# Patient Record
Sex: Male | Born: 1970 | Race: White | Hispanic: No | Marital: Married | Smoking: Never smoker
Health system: Southern US, Community
[De-identification: ages and names within clinical notes are randomized; demographics above are authoritative.]

## PROBLEM LIST (undated history)

## (undated) DIAGNOSIS — E119 Type 2 diabetes mellitus without complications: Secondary | ICD-10-CM

## (undated) HISTORY — PX: NO PAST SURGERIES: SHX2092

---

## 2021-05-31 ENCOUNTER — Encounter (HOSPITAL_COMMUNITY): Payer: Self-pay

## 2021-05-31 ENCOUNTER — Inpatient Hospital Stay (HOSPITAL_COMMUNITY): Payer: BC Managed Care – PPO

## 2021-05-31 ENCOUNTER — Inpatient Hospital Stay (HOSPITAL_COMMUNITY)
Admission: EM | Admit: 2021-05-31 | Discharge: 2021-06-02 | DRG: 061 | Disposition: A | Discharge status: Home / Self Care | Payer: BC Managed Care – PPO | Attending: Neurology | Admitting: Neurology

## 2021-05-31 ENCOUNTER — Other Ambulatory Visit: Payer: Self-pay

## 2021-05-31 ENCOUNTER — Emergency Department (HOSPITAL_COMMUNITY): Payer: BC Managed Care – PPO

## 2021-05-31 DIAGNOSIS — I10 Essential (primary) hypertension: Secondary | ICD-10-CM | POA: Diagnosis present

## 2021-05-31 DIAGNOSIS — G4733 Obstructive sleep apnea (adult) (pediatric): Secondary | ICD-10-CM | POA: Diagnosis present

## 2021-05-31 DIAGNOSIS — E119 Type 2 diabetes mellitus without complications: Secondary | ICD-10-CM | POA: Diagnosis present

## 2021-05-31 DIAGNOSIS — I639 Cerebral infarction, unspecified: Secondary | ICD-10-CM | POA: Diagnosis not present

## 2021-05-31 DIAGNOSIS — R471 Dysarthria and anarthria: Secondary | ICD-10-CM | POA: Diagnosis present

## 2021-05-31 DIAGNOSIS — I63212 Cerebral infarction due to unspecified occlusion or stenosis of left vertebral arteries: Secondary | ICD-10-CM | POA: Diagnosis present

## 2021-05-31 DIAGNOSIS — E785 Hyperlipidemia, unspecified: Secondary | ICD-10-CM | POA: Diagnosis present

## 2021-05-31 DIAGNOSIS — I63311 Cerebral infarction due to thrombosis of right middle cerebral artery: Secondary | ICD-10-CM | POA: Diagnosis present

## 2021-05-31 DIAGNOSIS — G8194 Hemiplegia, unspecified affecting left nondominant side: Secondary | ICD-10-CM | POA: Diagnosis present

## 2021-05-31 DIAGNOSIS — Z79899 Other long term (current) drug therapy: Secondary | ICD-10-CM | POA: Diagnosis not present

## 2021-05-31 DIAGNOSIS — R29703 NIHSS score 3: Secondary | ICD-10-CM | POA: Diagnosis present

## 2021-05-31 DIAGNOSIS — I6389 Other cerebral infarction: Secondary | ICD-10-CM | POA: Diagnosis not present

## 2021-05-31 DIAGNOSIS — Z91018 Allergy to other foods: Secondary | ICD-10-CM

## 2021-05-31 DIAGNOSIS — R29810 Facial weakness: Secondary | ICD-10-CM | POA: Diagnosis present

## 2021-05-31 DIAGNOSIS — R1310 Dysphagia, unspecified: Secondary | ICD-10-CM | POA: Diagnosis present

## 2021-05-31 DIAGNOSIS — E876 Hypokalemia: Secondary | ICD-10-CM | POA: Diagnosis present

## 2021-05-31 DIAGNOSIS — Z7984 Long term (current) use of oral hypoglycemic drugs: Secondary | ICD-10-CM

## 2021-05-31 HISTORY — DX: Type 2 diabetes mellitus without complications: E11.9

## 2021-05-31 LAB — GLUCOSE, CAPILLARY
Glucose-Capillary: 197 mg/dL — ABNORMAL HIGH (ref 70–99)
Glucose-Capillary: 175 mg/dL — ABNORMAL HIGH (ref 70–99)
Glucose-Capillary: 200 mg/dL — ABNORMAL HIGH (ref 70–99)
Glucose-Capillary: 187 mg/dL — ABNORMAL HIGH (ref 70–99)
Glucose-Capillary: 149 mg/dL — ABNORMAL HIGH (ref 70–99)
Glucose-Capillary: 228 mg/dL — ABNORMAL HIGH (ref 70–99)

## 2021-05-31 LAB — I-STAT CHEM 8, ED
BUN: 15 mg/dL (ref 6–20)
Calcium, Ion: 1.1 mmol/L — ABNORMAL LOW (ref 1.15–1.40)
Chloride: 105 mmol/L (ref 98–111)
Creatinine, Ser: 0.5 mg/dL — ABNORMAL LOW (ref 0.61–1.24)
Glucose, Bld: 250 mg/dL — ABNORMAL HIGH (ref 70–99)
HCT: 49 % (ref 39.0–52.0)
Hemoglobin: 16.7 g/dL (ref 13.0–17.0)
Potassium: 3.1 mmol/L — ABNORMAL LOW (ref 3.5–5.1)
Sodium: 142 mmol/L (ref 135–145)
TCO2: 22 mmol/L (ref 22–32)

## 2021-05-31 LAB — DIFFERENTIAL
Abs Immature Granulocytes: 0.04 10*3/uL (ref 0.00–0.07)
Basophils Absolute: 0.1 10*3/uL (ref 0.0–0.1)
Basophils Relative: 1 %
Eosinophils Absolute: 0 10*3/uL (ref 0.0–0.5)
Eosinophils Relative: 0 %
Immature Granulocytes: 0 %
Lymphocytes Relative: 12 %
Lymphs Abs: 1.3 10*3/uL (ref 0.7–4.0)
Monocytes Absolute: 0.6 10*3/uL (ref 0.1–1.0)
Monocytes Relative: 6 %
Neutro Abs: 8.6 10*3/uL — ABNORMAL HIGH (ref 1.7–7.7)
Neutrophils Relative %: 81 %

## 2021-05-31 LAB — HIV ANTIBODY (ROUTINE TESTING W REFLEX): HIV Screen 4th Generation wRfx: NONREACTIVE

## 2021-05-31 LAB — LIPID PANEL
Cholesterol: 247 mg/dL — ABNORMAL HIGH (ref 0–200)
HDL: 44 mg/dL (ref >40)
LDL Cholesterol: 196 mg/dL — ABNORMAL HIGH (ref 0–99)
Total CHOL/HDL Ratio: 5.6 RATIO
Triglycerides: 37 mg/dL (ref <150)
VLDL: 7 mg/dL (ref 0–40)

## 2021-05-31 LAB — CBC
HCT: 46.3 % (ref 39.0–52.0)
Hemoglobin: 15.5 g/dL (ref 13.0–17.0)
MCH: 28.8 pg (ref 26.0–34.0)
MCHC: 33.5 g/dL (ref 30.0–36.0)
MCV: 86.1 fL (ref 80.0–100.0)
Platelets: 240 10*3/uL (ref 150–400)
RBC: 5.38 MIL/uL (ref 4.22–5.81)
RDW: 12.9 % (ref 11.5–15.5)
WBC: 10.7 10*3/uL — ABNORMAL HIGH (ref 4.0–10.5)
nRBC: 0 % (ref 0.0–0.2)

## 2021-05-31 LAB — CBG MONITORING, ED: Glucose-Capillary: 222 mg/dL — ABNORMAL HIGH (ref 70–99)

## 2021-05-31 LAB — COMPREHENSIVE METABOLIC PANEL
ALT: 23 U/L (ref 0–44)
AST: 17 U/L (ref 15–41)
Albumin: 4.5 g/dL (ref 3.5–5.0)
Alkaline Phosphatase: 88 U/L (ref 38–126)
Anion gap: 13 (ref 5–15)
BUN: 13 mg/dL (ref 6–20)
CO2: 21 mmol/L — ABNORMAL LOW (ref 22–32)
Calcium: 9.2 mg/dL (ref 8.9–10.3)
Chloride: 106 mmol/L (ref 98–111)
Creatinine, Ser: 0.69 mg/dL (ref 0.61–1.24)
GFR, Estimated: >60 mL/min (ref >60)
Glucose, Bld: 246 mg/dL — ABNORMAL HIGH (ref 70–99)
Potassium: 3.1 mmol/L — ABNORMAL LOW (ref 3.5–5.1)
Sodium: 140 mmol/L (ref 135–145)
Total Bilirubin: 1.1 mg/dL (ref 0.3–1.2)
Total Protein: 7.6 g/dL (ref 6.5–8.1)

## 2021-05-31 LAB — HEMOGLOBIN A1C
Hgb A1c MFr Bld: 9.5 % — ABNORMAL HIGH (ref 4.8–5.6)
Mean Plasma Glucose: 225.95 mg/dL

## 2021-05-31 LAB — PROTIME-INR
INR: 1 (ref 0.8–1.2)
Prothrombin Time: 13 seconds (ref 11.4–15.2)

## 2021-05-31 LAB — APTT: aPTT: 24 seconds (ref 24–36)

## 2021-05-31 LAB — MRSA NEXT GEN BY PCR, NASAL: MRSA by PCR Next Gen: NOT DETECTED

## 2021-05-31 IMAGING — MR MR HEAD W/O CM
10 of 11 series · 43 of 48 positions shown · non-contrast
Comparison: Comparison made with prior head CTs and CTA from
earlier the same day.

CLINICAL DATA: Initial evaluation for acute neuro deficit, stroke
suspected

EXAM:
MRI HEAD WITHOUT CONTRAST
TECHNIQUE: Multiplanar, multiecho pulse sequences of the brain and surrounding
structures were obtained without intravenous contrast.

[Series 5: DWI · axial · 3.0mm · 0.88mm/px · z∈[-96,+50]mm · 10 of 100 slices shown (1 of 4)]
[im 1/100]
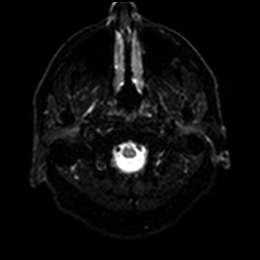
[im 12/100]
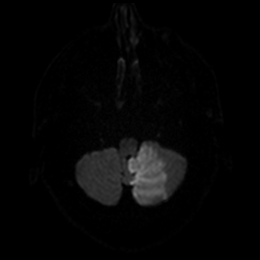
[im 23/100]
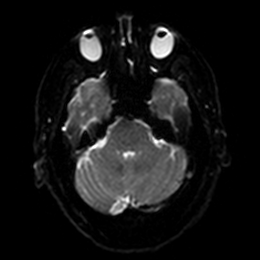
[im 34/100]
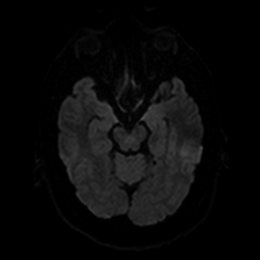
[im 45/100]
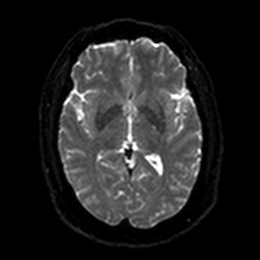
[im 56/100]
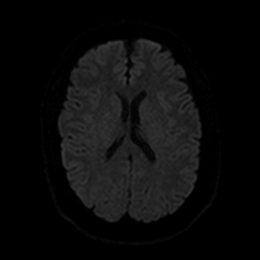
[im 67/100]
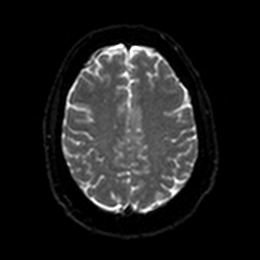
[im 78/100]
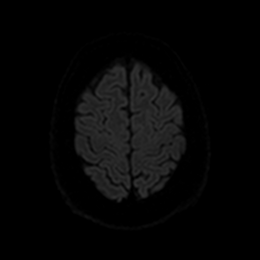
[im 89/100]
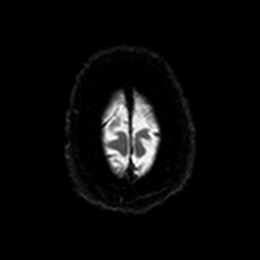
[im 100/100]
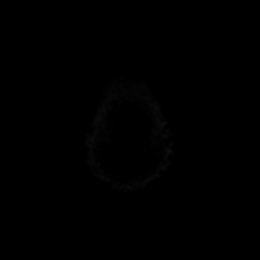

[Series 6: DWI · axial · 3.0mm · 0.88mm/px · z∈[-96,+50]mm · 5 of 50 slices shown (2 of 4)]
[im 1/50]
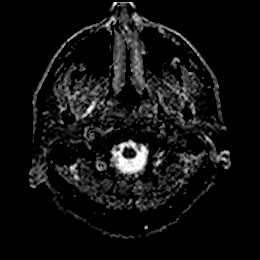
[im 13/50]
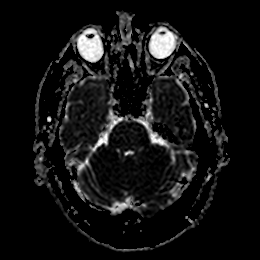
[im 25/50]
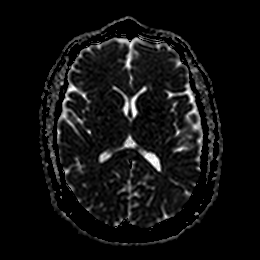
[im 37/50]
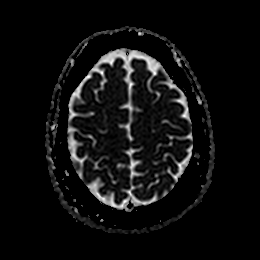
[im 50/50]
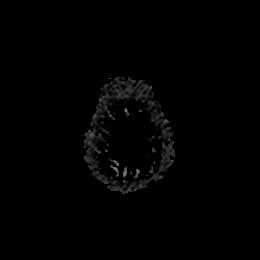

[Series 7: DWI · coronal · 4.0mm · 0.88mm/px · 6 of 64 slices shown (3 of 4)]
[im 1/64]
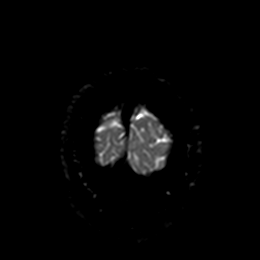
[im 13/64]
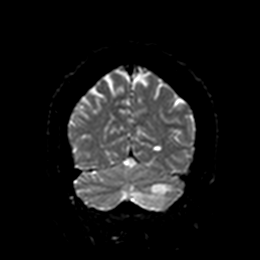
[im 26/64]
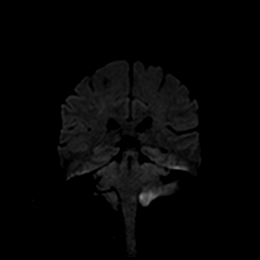
[im 38/64]
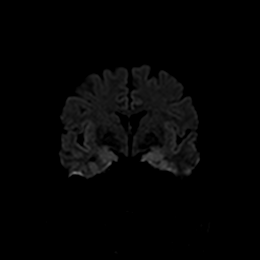
[im 51/64]
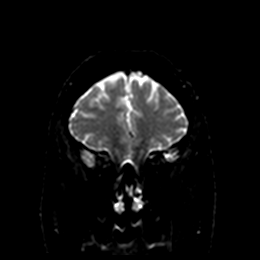
[im 64/64]
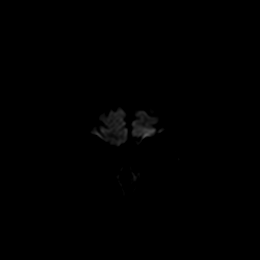

[Series 8: DWI · coronal · 4.0mm · 0.88mm/px · 3 of 32 slices shown (4 of 4)]
[im 1/32]
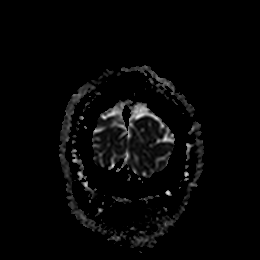
[im 16/32]
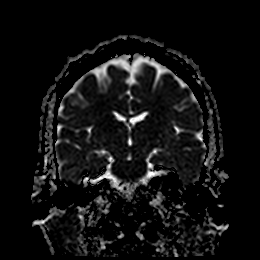
[im 32/32]
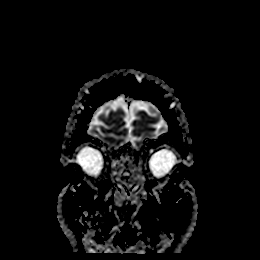

[Series 9: T1 · sagittal · 5.0mm · 0.78mm/px · 2 of 23 slices shown]
[im 1/23]
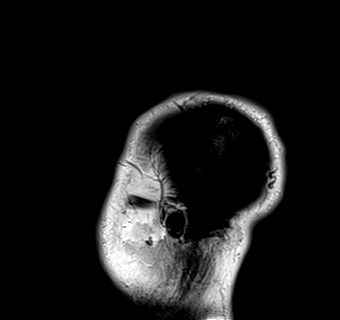
[im 23/23]
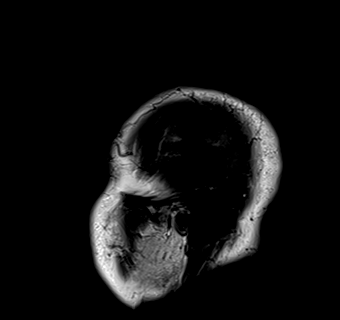

[Series 10: T2 · axial · 5.0mm · 0.72mm/px · z∈[-95,+49]mm · 2 of 25 slices shown (1 of 2)]
[im 1/25]
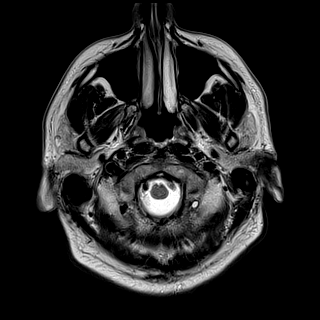
[im 25/25]
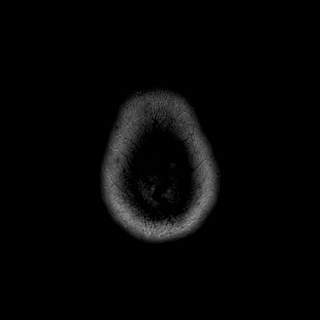

[Series 11: FLAIR · axial · 5.0mm · 0.45mm/px · z∈[-96,+48]mm · 2 of 25 slices shown]
[im 1/25]
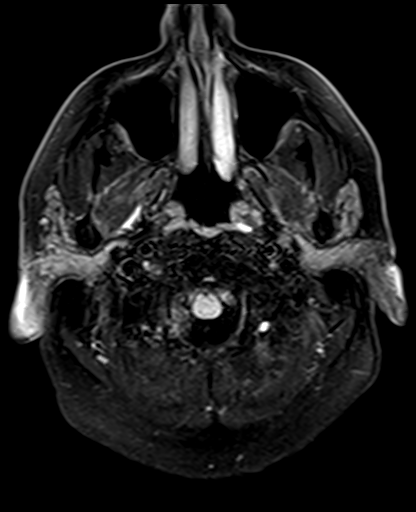
[im 25/25]
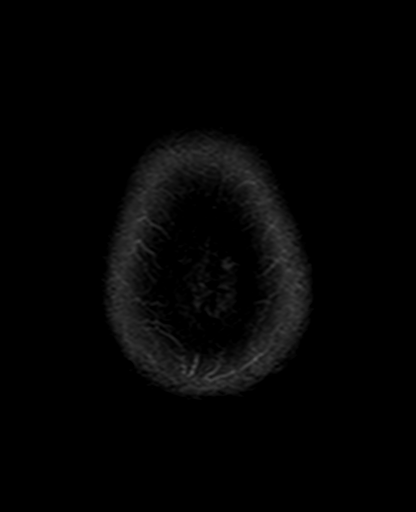

[Series 13: pha_images · axial · 3.0mm · 0.90mm/px · z∈[-112,+58]mm · 5 of 58 slices shown]
[im 1/58]
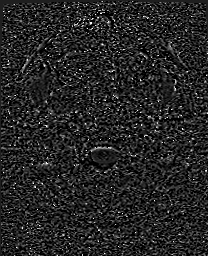
[im 15/58]
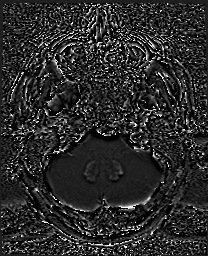
[im 29/58]
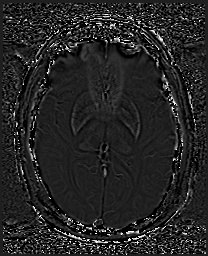
[im 43/58]
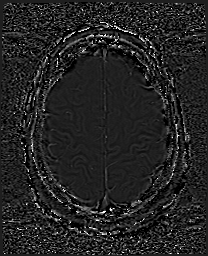
[im 58/58]
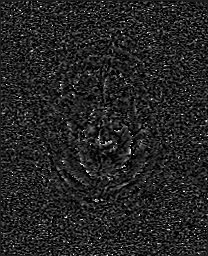

[Series 14: swi_images · axial · 3.0mm · 0.90mm/px · z∈[-112,+64]mm · 5 of 60 slices shown]
[im 1/60]
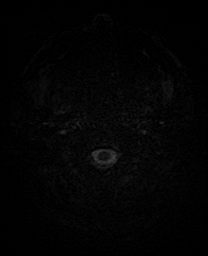
[im 15/60]
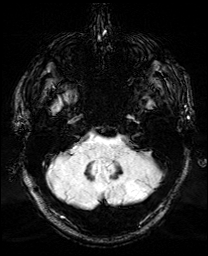
[im 30/60]
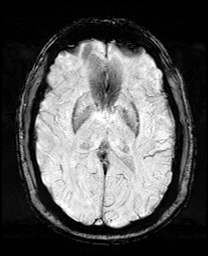
[im 45/60]
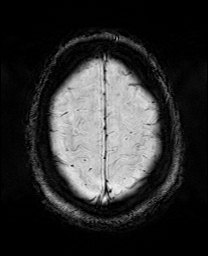
[im 60/60]
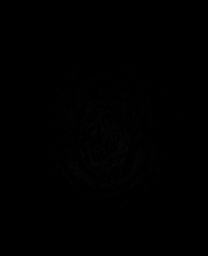

[Series 17: T2 · coronal · 5.0mm · 0.34mm/px · 3 of 29 slices shown (2 of 2)]
[im 1/29]
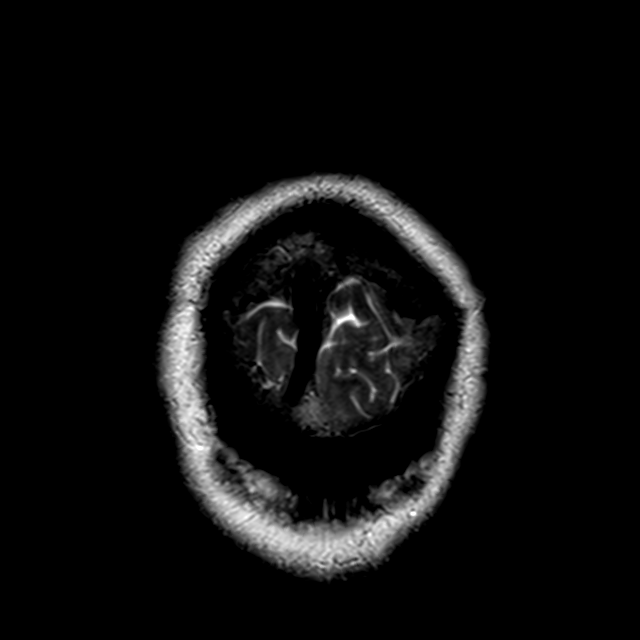
[im 15/29]
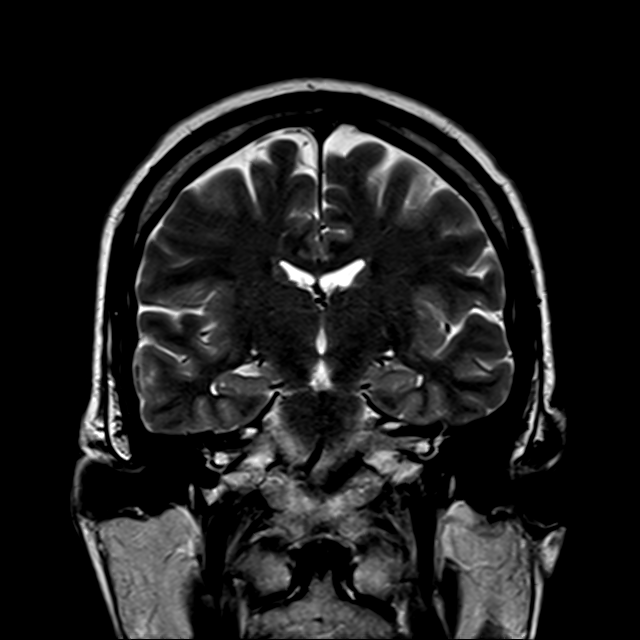
[im 29/29]
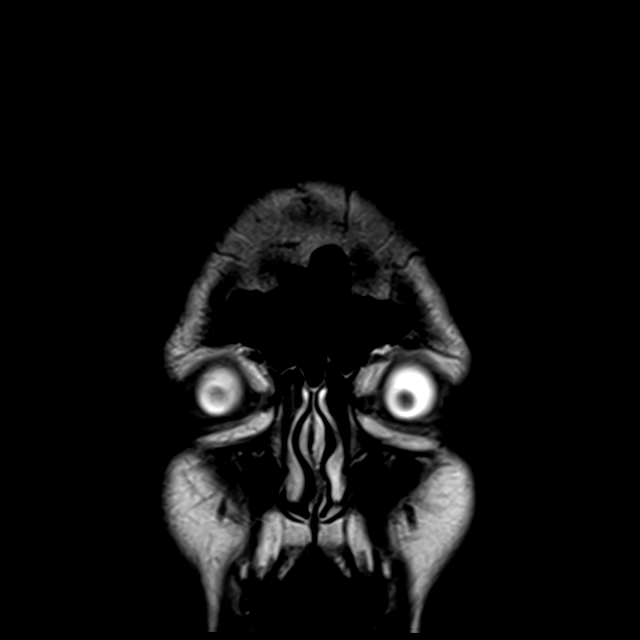

[43 of 48 positions shown; findings below may reference images not displayed]

FINDINGS: Brain: Cerebral volume within normal limits. No significant cerebral
white matter disease.

Confluent restricted diffusion involving the inferior left
cerebellum consistent with an evolving acute left PICA distribution
infarct (series 5, image 56). Area of infarction measures
approximately 5.3 x 3.3 cm in size. Minimal patchy involvement of
the left lateral margin of the medulla noted as well (series 5,
image 55). No associated hemorrhage. Mild localized edema without
significant regional mass effect at this time. Fourth ventricle and
fourth ventricular outflow tract remain patent at this time.

No other evidence for acute or subacute ischemia. Gray-white matter
differentiation otherwise maintained. No encephalomalacia to suggest
chronic cortical infarction or other insult. No acute or chronic
intracranial blood products.

No mass lesion or midline shift. No hydrocephalus or extra-axial
fluid collection. Pituitary gland suprasellar region normal. Midline
structures intact and normal.

Vascular: Loss of normal flow void within the left V4 segment,
consistent with previously identified occlusion. Major intracranial
vascular flow voids otherwise maintained.

Skull and upper cervical spine: Craniocervical junction within
normal limits. Bone marrow signal intensity normal. No scalp soft
tissue abnormality.

Sinuses/Orbits: Conjugate right gaze noted. Globes orbital soft
tissues demonstrate no other acute finding. Paranasal sinuses are
otherwise clear. No mastoid effusion. Inner ear structures grossly
normal.

Other: None.
IMPRESSION: 1. Evolving acute left PICA distribution infarct involving the left
cerebellum and adjacent left lateral medulla as above. No associated
hemorrhage. Localized edema without significant regional mass
effect. Fourth ventricle and fourth ventricular outflow tract remain
patent at this time. No hydrocephalus.
2. Loss of normal flow void within the left V4 segment, consistent
with previously identified occlusion.
3. Otherwise normal brain MRI.

## 2021-05-31 IMAGING — CT CT HEAD W/O CM
4 series · 16 of 47 positions shown, 18 images · non-contrast
Comparison: Head CT and CTA head and neck earlier today since [7U]
hours.

CLINICAL DATA: 50-year-old male with sudden severe headache. Code
stroke presentation [7U] hours today. Distal left vertebral artery
occlusion on CTA.



[Series 3: head without · axial · non-contrast · 0.45mm/px · z∈[-121,+4]mm · 7 of 35 slices shown, 9 images]
[im 5/35  brain]
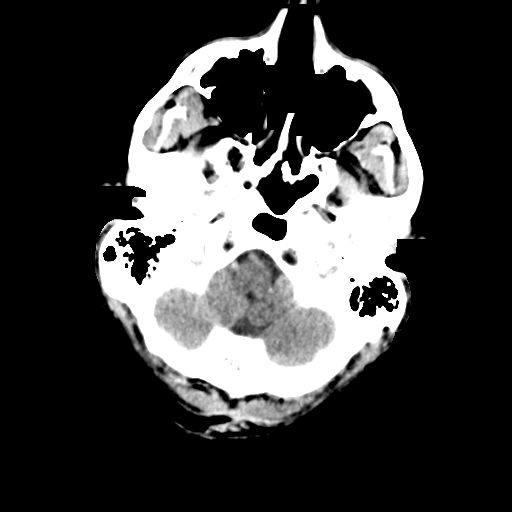
[im 5/35  bone]
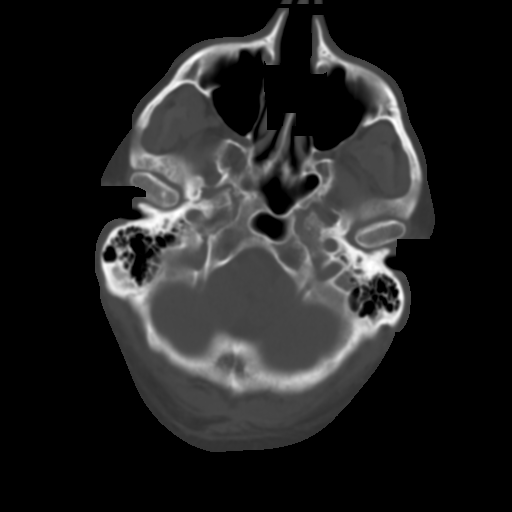
[im 9/35  brain]
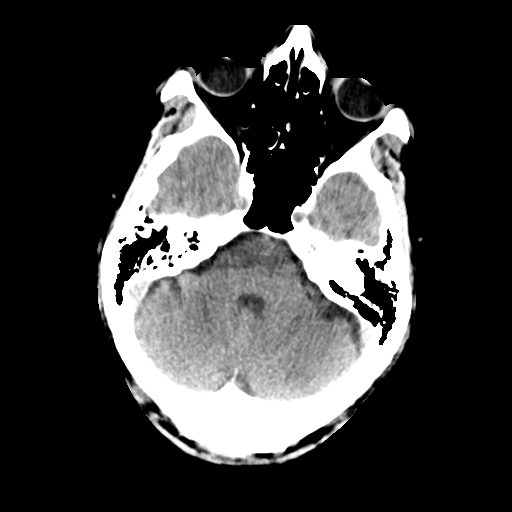
[im 13/35  brain]
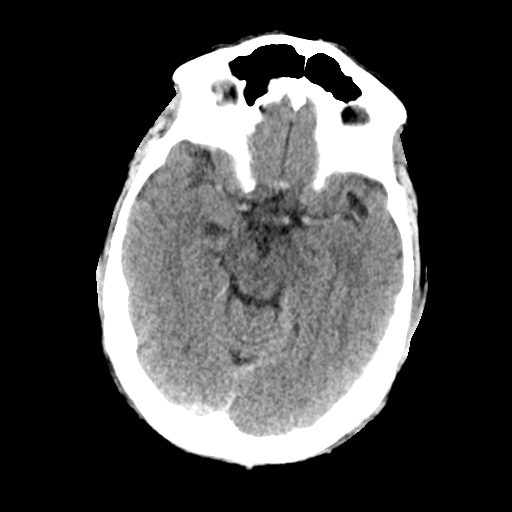
[im 18/35  brain]
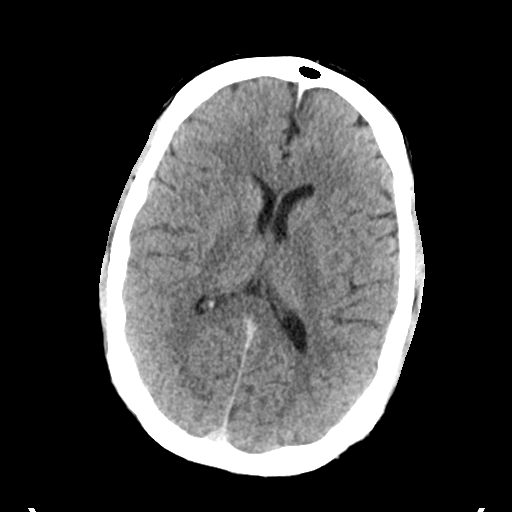
[im 22/35  brain]
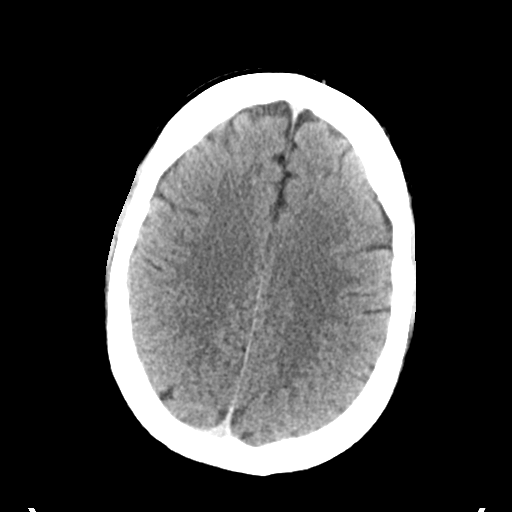
[im 22/35  bone]
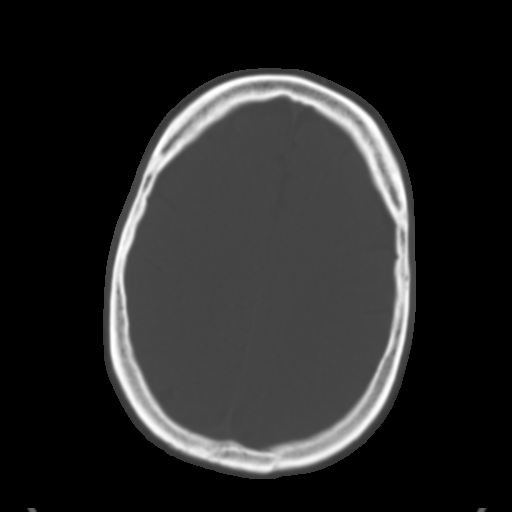
[im 26/35  brain]
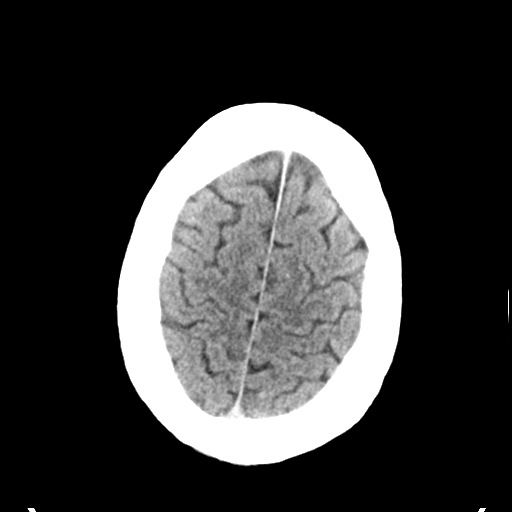
[im 30/35  brain]
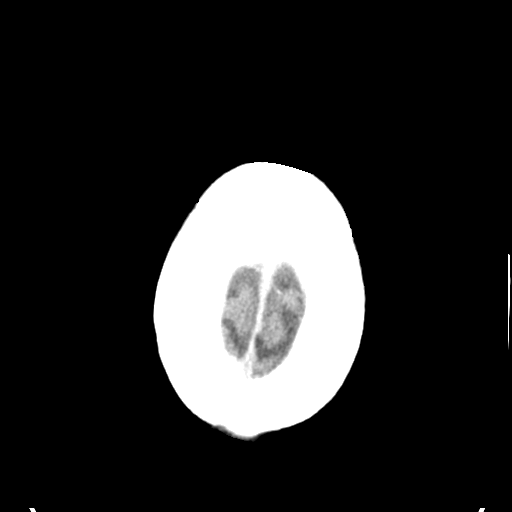

[Series 4: head bone · axial · 0.45mm/px · z∈[-125,-89]mm · 3 of 88 slices shown]
[im 9/88  bone]
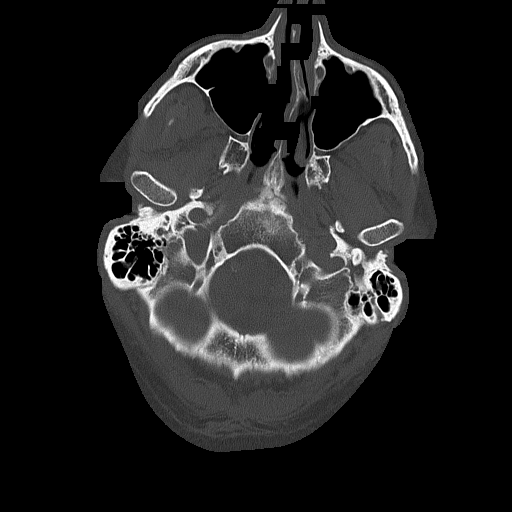
[im 18/88  bone]
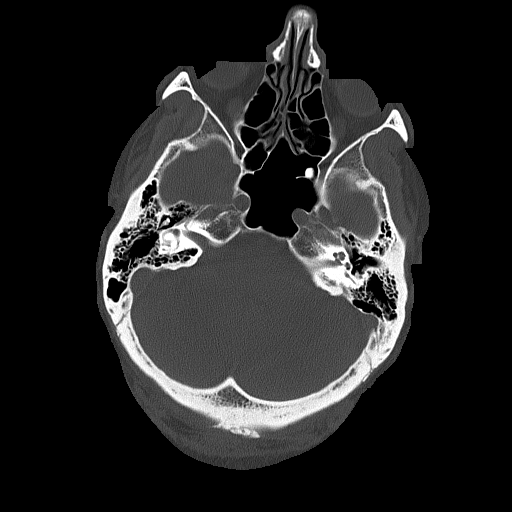
[im 27/88  bone]
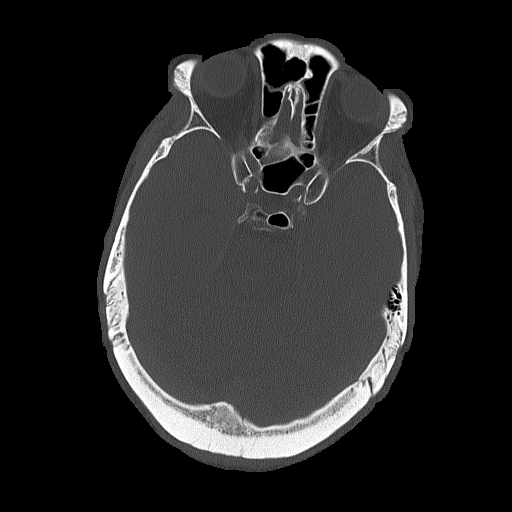

[Series 5: head without cor · coronal · non-contrast · 0.34mm/px · 3 of 73 slices shown]
[im 29/73  brain]
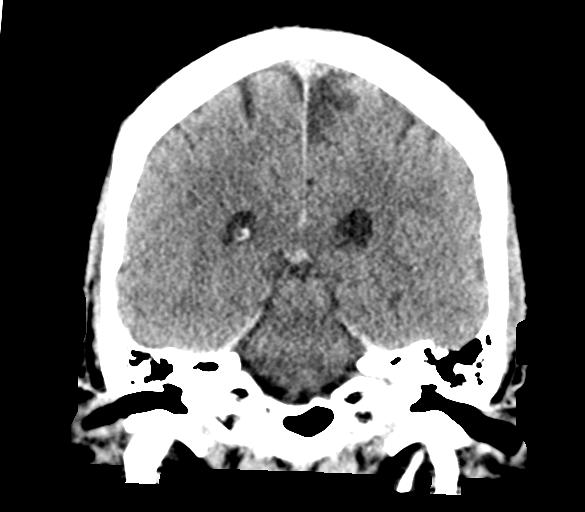
[im 34/73  brain]
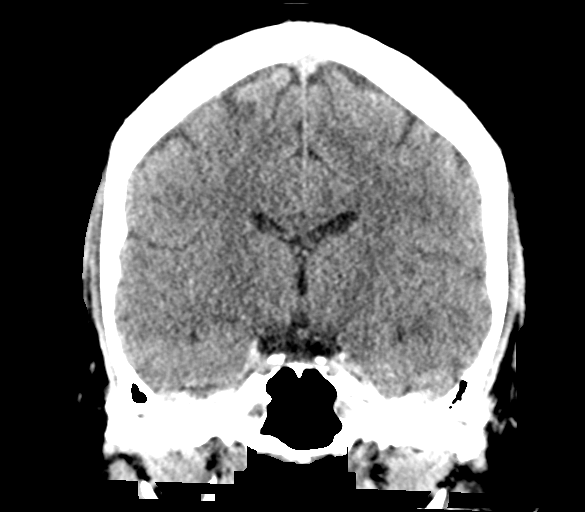
[im 39/73  brain]
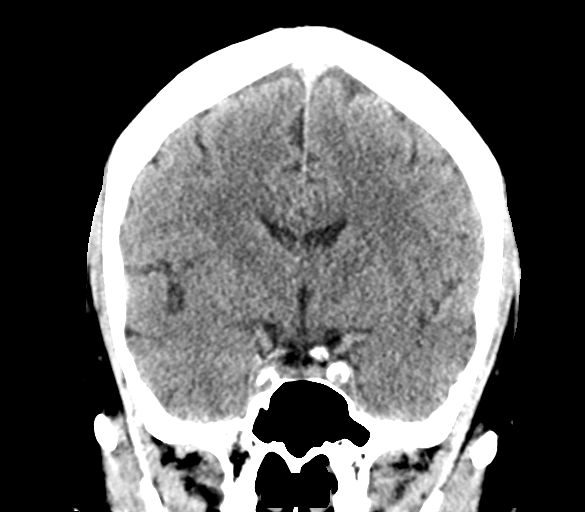

[Series 6: head without sag · sagittal · non-contrast · 0.33mm/px · 3 of 63 slices shown]
[im 21/63  brain]
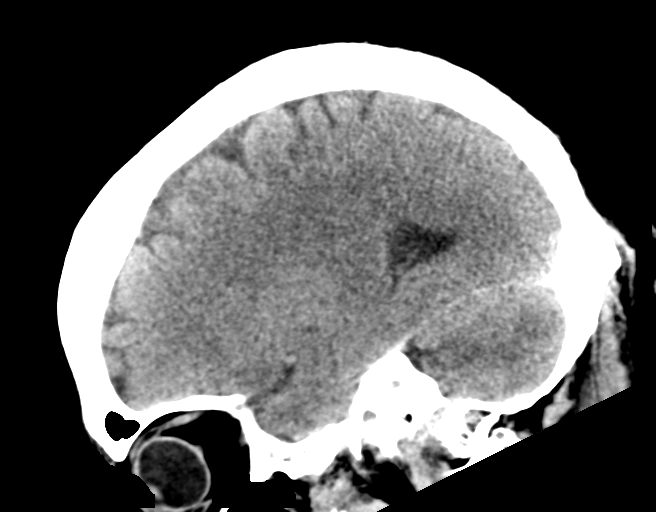
[im 32/63  brain]
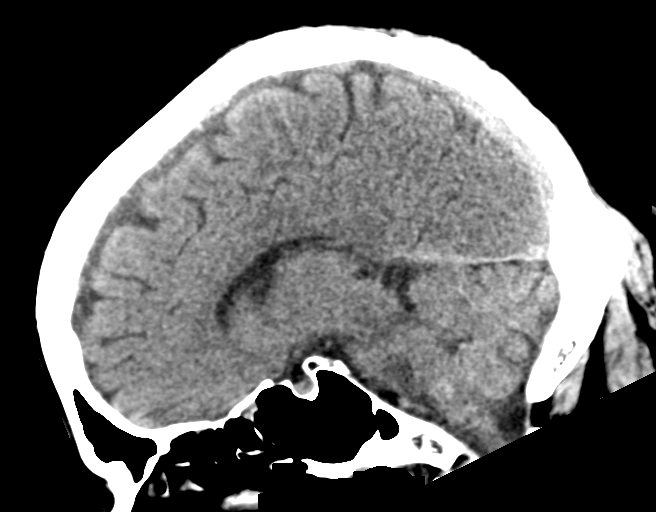
[im 42/63  brain]
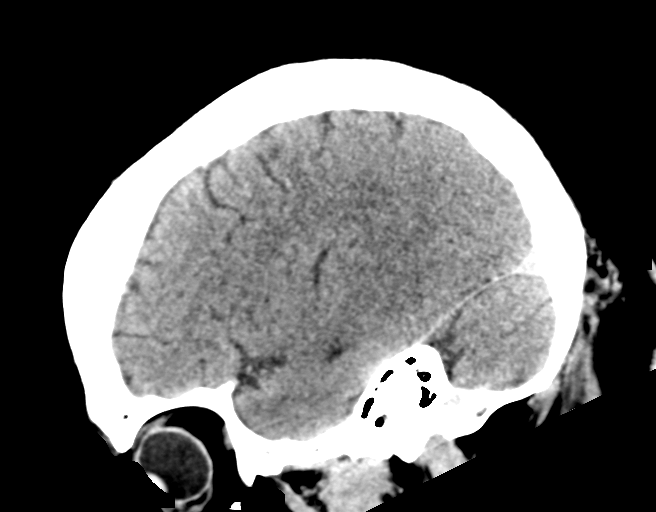

[16 of 47 positions shown; findings below may reference images not displayed]

FINDINGS: Brain: Normal cerebral volume.

Subtle new hypodensity left cerebellum PICA territory (coronal image
59 now versus series 5, image 60 on the initial head CT). No
associated hemorrhage or mass effect.

Elsewhere gray-white matter differentiation appears stable. No other
acute or evolving infarct identified. No midline shift,
ventriculomegaly, mass effect, evidence of mass lesion, or
intracranial hemorrhage identified.

Vascular: Calcified atherosclerosis at the skull base. Mild if any
residual intravascular contrast.

Skull: No acute osseous abnormality identified.

Sinuses/Orbits: Visualized paranasal sinuses and mastoids are stable
and well aerated.

Other: Visualized orbits and scalp soft tissues are within normal
limits.
IMPRESSION: 1. Head CT appearance now highly suspicious for evolving Left PICA
infarct. No hemorrhage or mass effect.
2. Elsewhere stable and negative CT appearance of the brain.
3. These results were communicated to Dr. JOABY at [DATE] on
[DATE] by text page via the [employer] messaging system.

## 2021-05-31 IMAGING — CT CT HEAD CODE STROKE
3 series · 14 of 47 positions shown, 16 images · non-contrast
Comparison: None available.

CLINICAL DATA: Code stroke. Initial evaluation for neuro deficit,
stroke suspected.



[Series 3: head 5.0 st · axial · 0.44mm/px · z∈[-142,+8]mm · 8 of 36 slices shown, 10 images]
[im 3/36  brain]
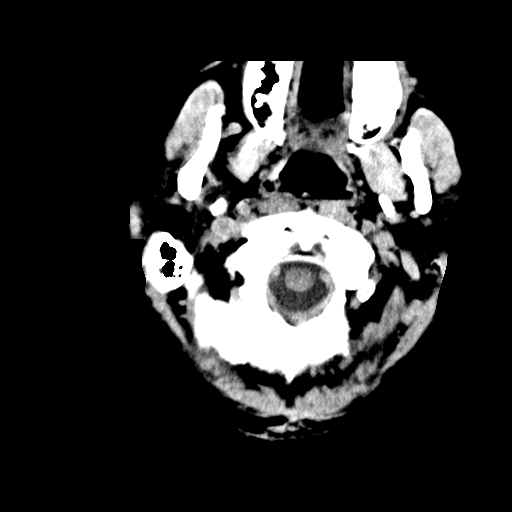
[im 3/36  bone]
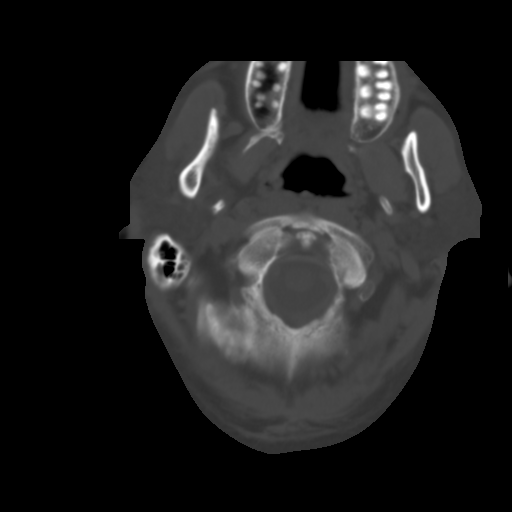
[im 8/36  brain]
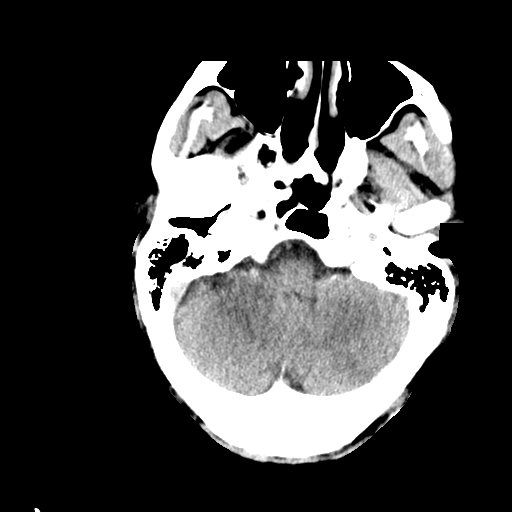
[im 11/36  brain]
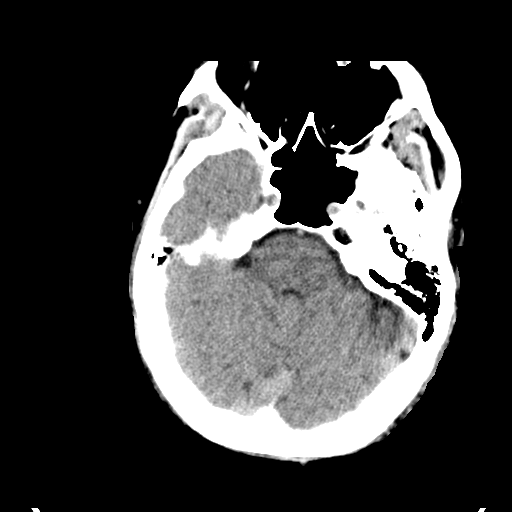
[im 16/36  brain]
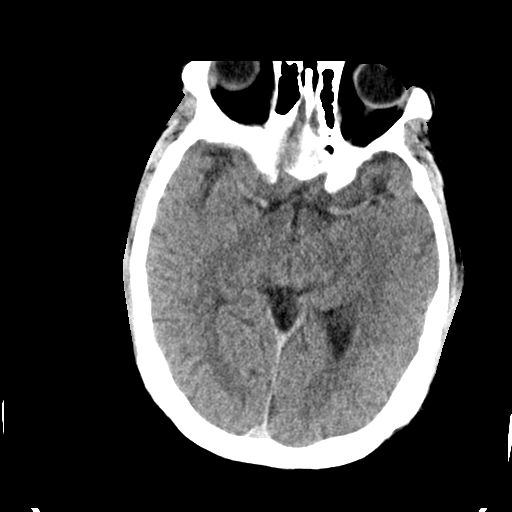
[im 20/36  brain]
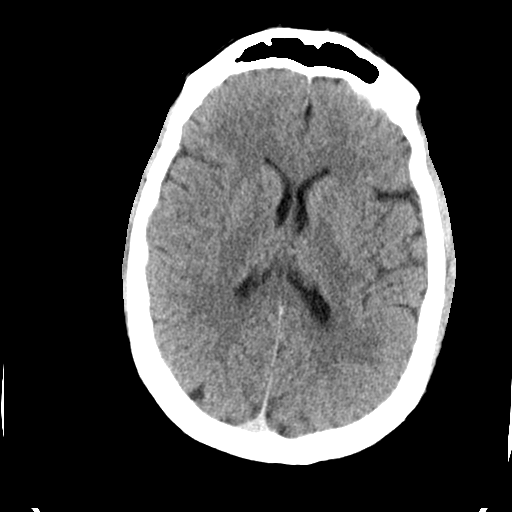
[im 20/36  bone]
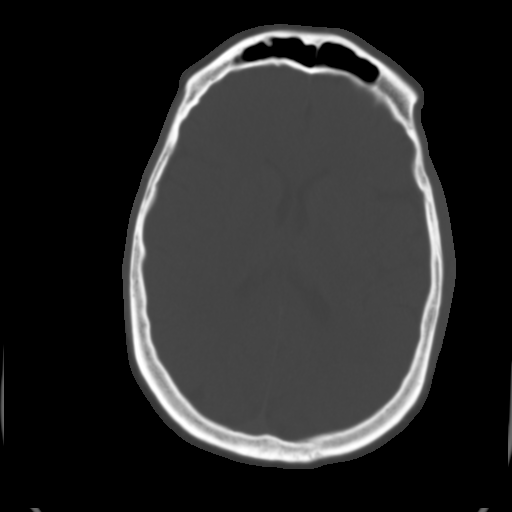
[im 25/36  brain]
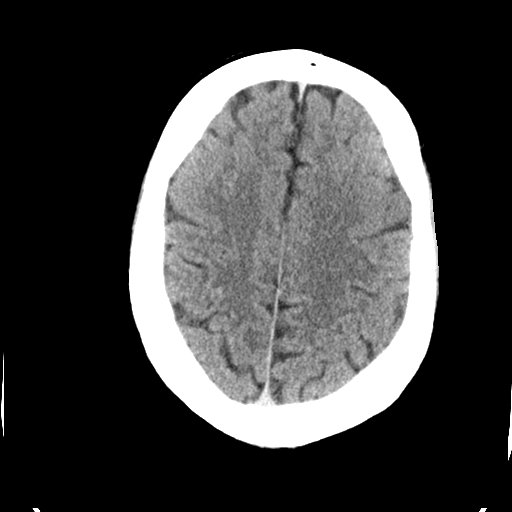
[im 28/36  brain]
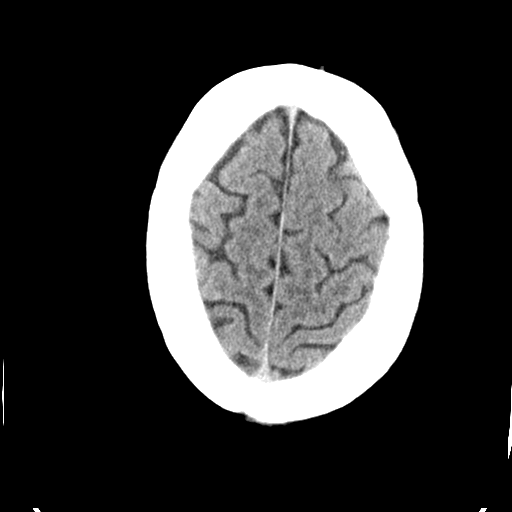
[im 33/36  brain]
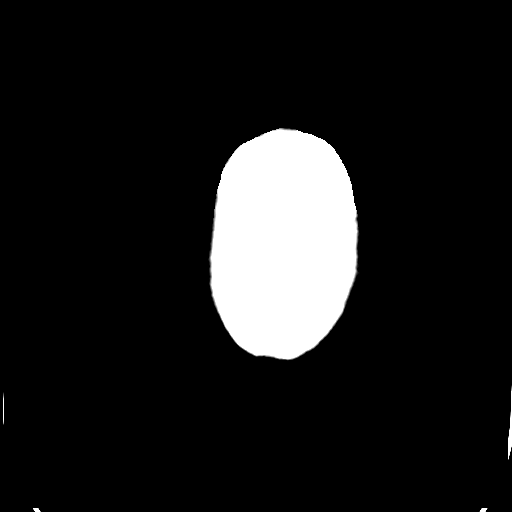

[Series 5: head 3.0 cor st · coronal · 0.34mm/px · 3 of 76 slices shown]
[im 31/76  brain]
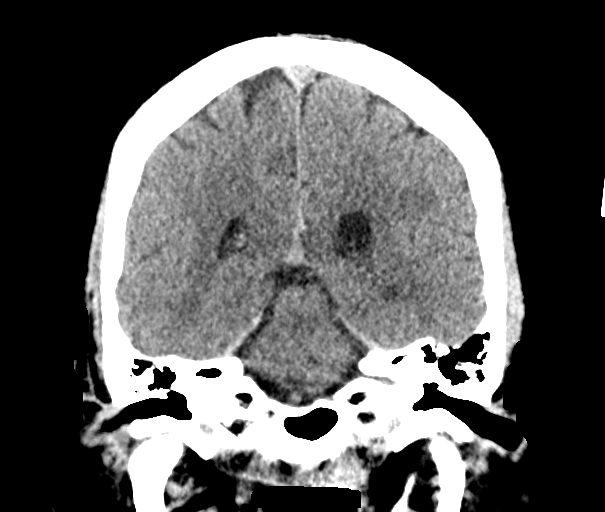
[im 36/76  brain]
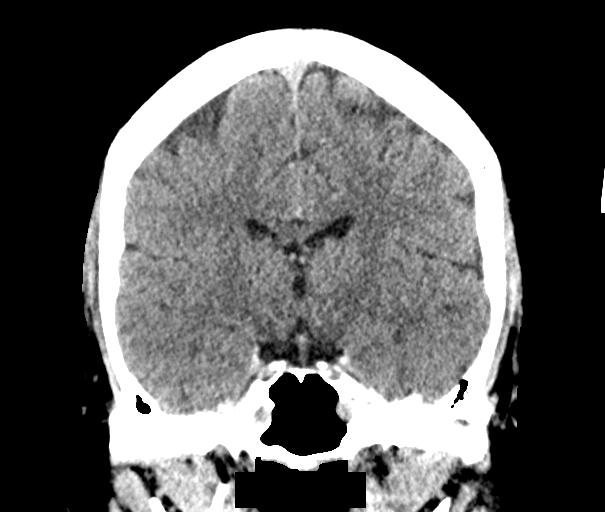
[im 41/76  brain]
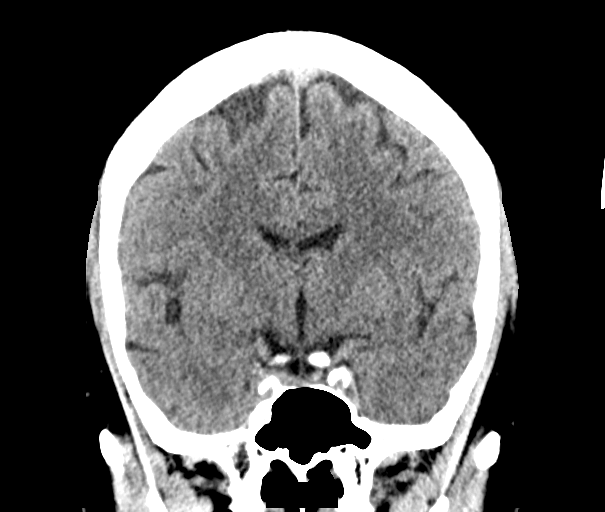

[Series 6: head 3.0 sag st · sagittal · 0.34mm/px · 3 of 67 slices shown]
[im 23/67  brain]
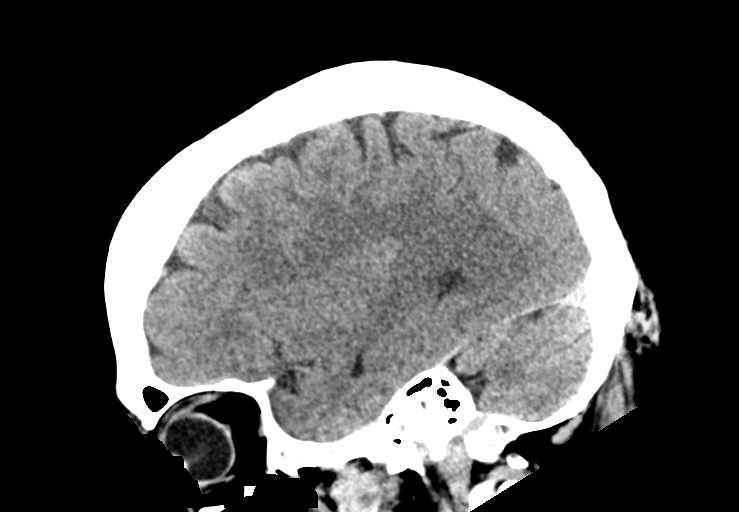
[im 34/67  brain]
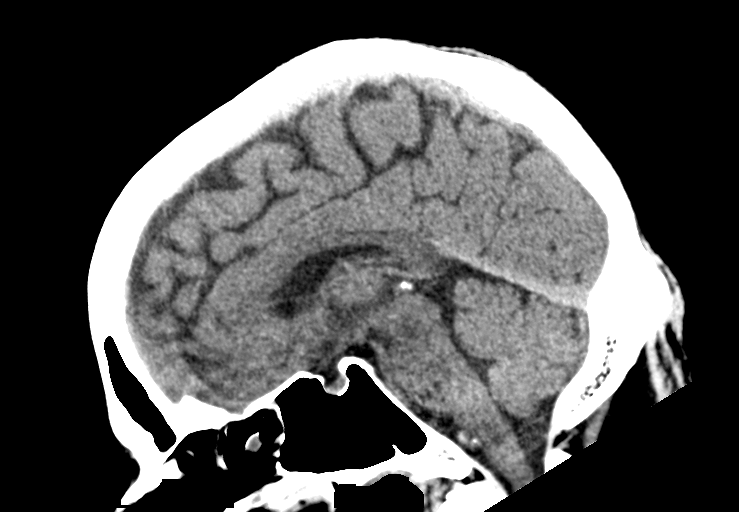
[im 45/67  brain]
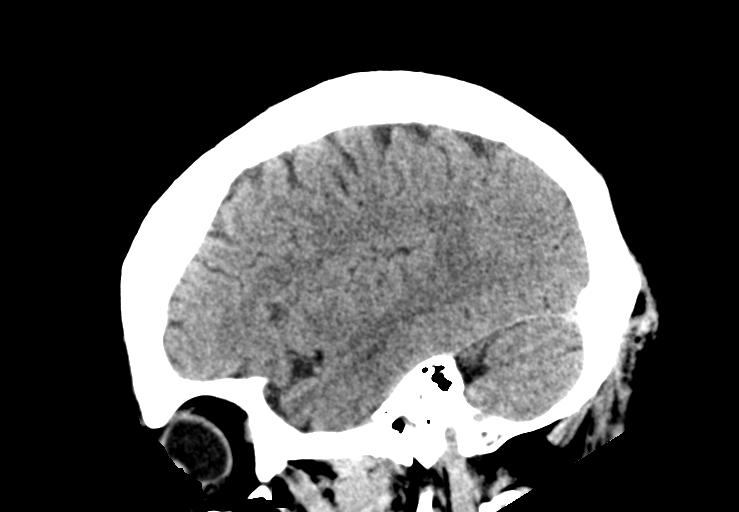

[14 of 47 positions shown; findings below may reference images not displayed]

FINDINGS: Brain: Cerebral volume within normal limits. No acute intracranial
hemorrhage. No acute large vessel territory infarct. No mass lesion,
mass effect or midline shift. No hydrocephalus or extra-axial fluid
collection.

Vascular: No hyperdense vessel. Calcified atherosclerosis present at
the skull base.

Skull: Scalp soft tissues and calvarium within normal limits.

Sinuses/Orbits: Globes and orbital soft tissues within normal
limits. Mild mucosal thickening noted within the ethmoidal air
cells. Small amount of relating secretions noted within the
nasopharynx. Mastoid air cells are clear.

Other: None.

ASPECTS (Alberta Stroke Program Early CT Score)

- Ganglionic level infarction (caudate, lentiform nuclei, internal
capsule, insula, M1-M3 cortex): 7

- Supraganglionic infarction (M4-M6 cortex): 3

Total score (0-10 with 10 being normal): 10
IMPRESSION: 1. Negative head CT.  No acute intracranial abnormality.
2. ASPECTS is 10.

These results were communicated to Dr. CEEJAY at [DATE] on
[DATE] by text page via the [employer] messaging system.

## 2021-05-31 IMAGING — CT CT ANGIO HEAD-NECK (W OR W/O PERF)
3 of 7 series · 9 of 35 positions shown · IV contrast (OMNI 350)
Comparison: Prior head CT from earlier the same day.

CLINICAL DATA: Initial evaluation for neuro deficit, stroke
suspected.

EXAM:
CT ANGIOGRAPHY HEAD AND NECK
TECHNIQUE: Multidetector CT imaging of the head and neck was performed using
the standard protocol during bolus administration of intravenous
contrast. Multiplanar CT image reconstructions and MIPs were
obtained to evaluate the vascular anatomy. Carotid stenosis
measurements (when applicable) are obtained utilizing NASCET
criteria, using the distal internal carotid diameter as the
denominator.

[Series 5: cta neck · axial · 0.54mm/px · z∈[-231,-103]mm · 2 of 194 slices shown]
[im 65/194  soft-tissue]
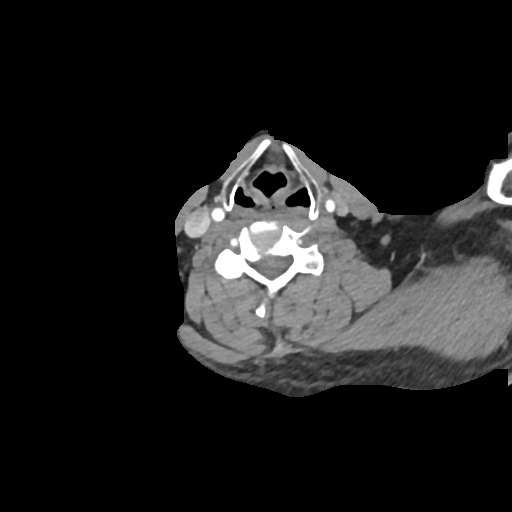
[im 129/194  soft-tissue]
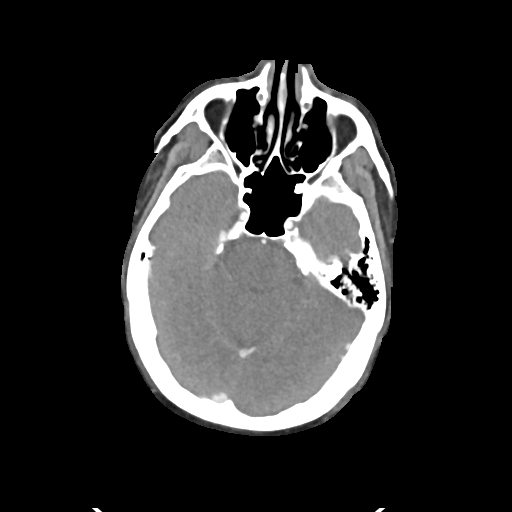

[Series 7: cta neck axial · axial · 0.39mm/px · z∈[-336,-51]mm · 6 of 403 slices shown]
[im 58/403  soft-tissue]
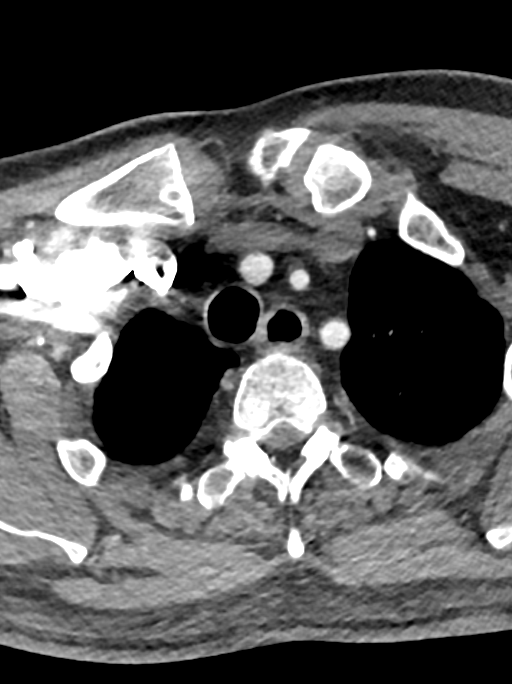
[im 115/403  bone]
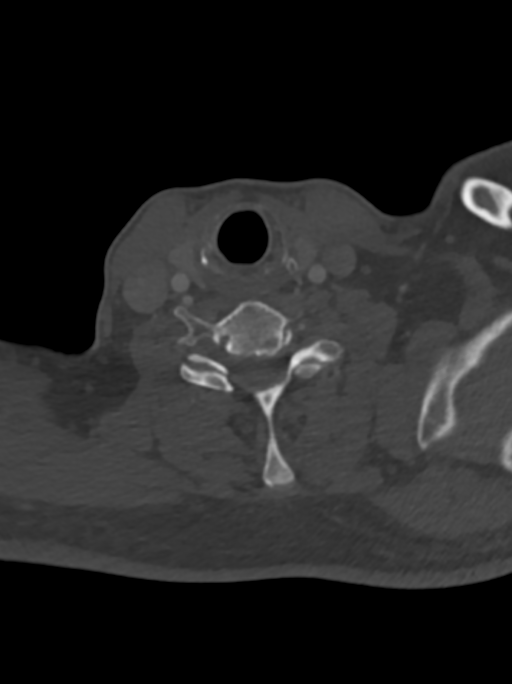
[im 173/403  soft-tissue]
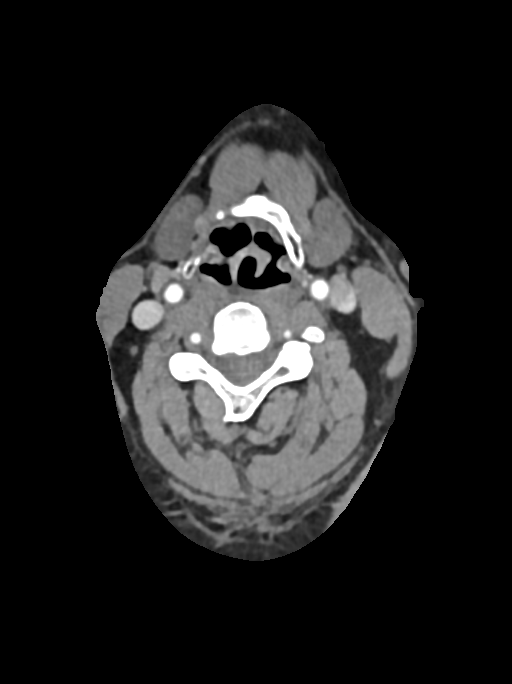
[im 230/403  bone]
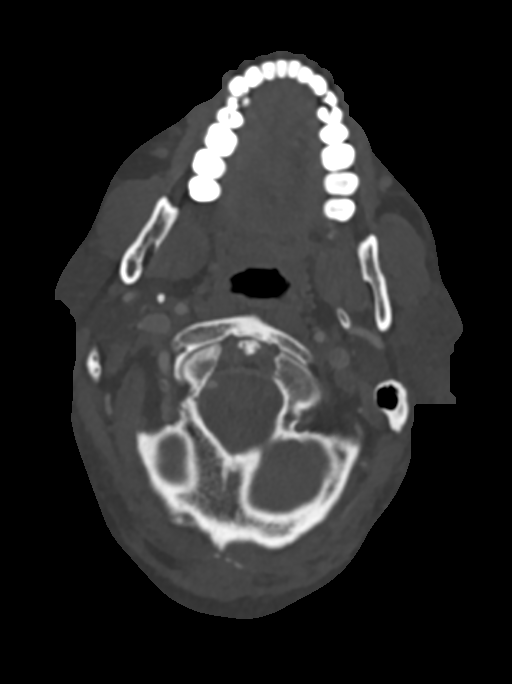
[im 288/403  soft-tissue]
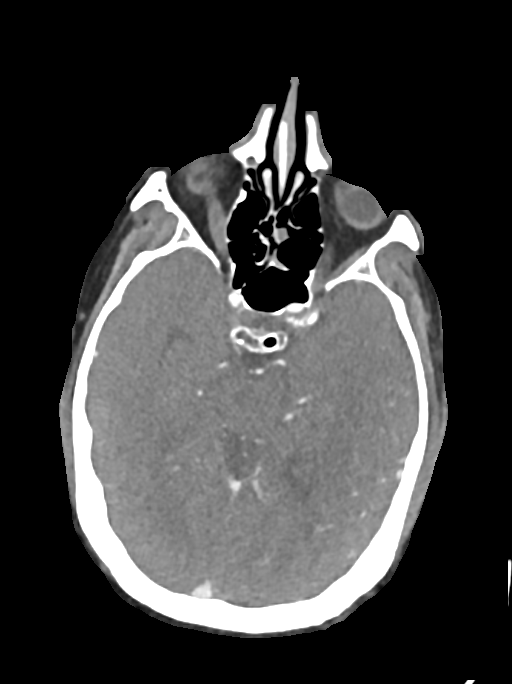
[im 345/403  bone]
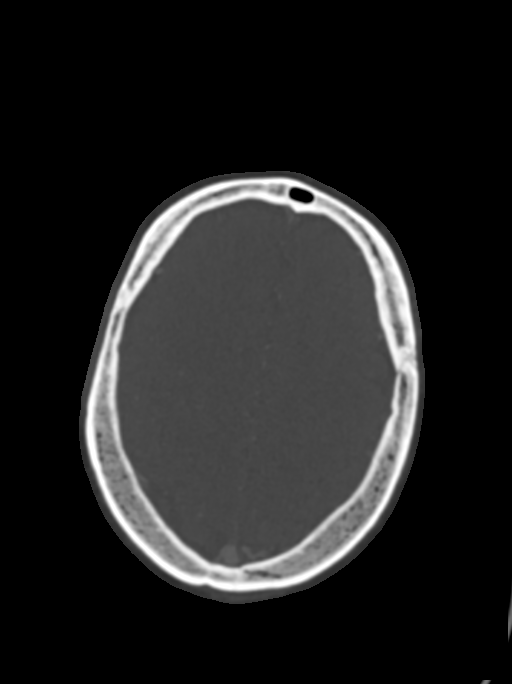

[Series 9: cta neck sagittal · sagittal · 0.50mm/px · 1 of 201 slices shown]
[im 89/201  soft-tissue]
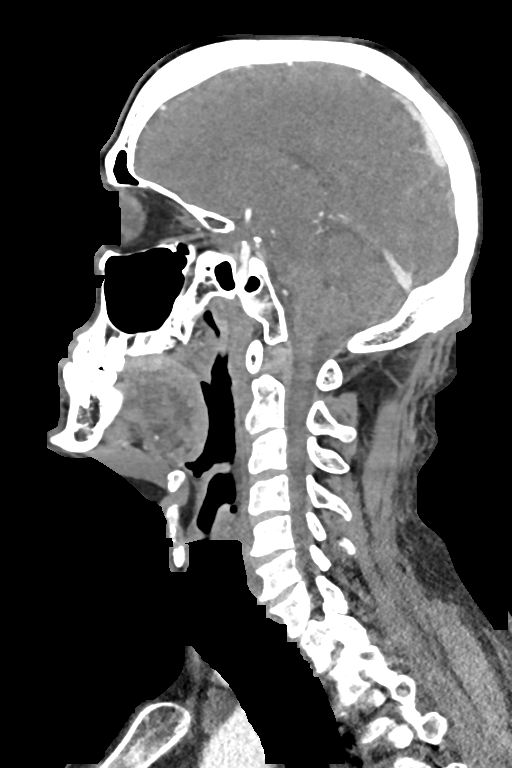

[9 of 35 positions shown; findings below may reference images not displayed]

RADIATION DOSE REDUCTION: This exam was performed according to the
departmental dose-optimization program which includes automated
exposure control, adjustment of the mA and/or kV according to
patient size and/or use of iterative reconstruction technique.

CONTRAST:  100mL OMNIPAQUE IOHEXOL 350 MG/ML SOLN
FINDINGS: CTA NECK FINDINGS

Aortic arch: Visualized aortic arch normal caliber with normal
branch pattern. No stenosis about the origin the great vessels.

Right carotid system: Right common and internal carotid arteries
widely patent without stenosis, dissection or occlusion.

Left carotid system: Diffuse intimal thickening seen within the left
CCA with no more than mild narrowing. No significant atheromatous
disease about the left carotid bulb. Left ICA patent distally
without stenosis or dissection.

Vertebral arteries: Both vertebral arteries arise from the
subclavian arteries. No proximal subclavian artery stenosis. Right
vertebral artery dominant and widely patent without stenosis or
dissection. On the left, the left V1 and V2 segments are mildly
irregular but remain patent without stenosis. There is diffuse
narrowing, attenuation, and irregularity of the left V3 segment just
prior to the skull base, age indeterminate (series 7, images 183,
187). No visible raised dissection flap or intraluminal thrombus.
There is secondary moderate diffuse narrowing of the left vertebral
artery at this level.

Skeleton: No discrete or worrisome osseous lesions.

Other neck: No other acute soft tissue abnormality within the neck.

Upper chest: Scattered atelectatic changes noted within the
visualized lungs. Visualized upper chest demonstrates no other acute
finding.

Review of the MIP images confirms the above findings

CTA HEAD FINDINGS

Anterior circulation: Petrous segments patent bilaterally.
Atheromatous change within the carotid siphons with associated mild
multifocal narrowing. A1 segments patent bilaterally. Normal
anterior communicating artery complex. Both ACAs patent to their
distal aspects without stenosis. No M1 stenosis or occlusion. Normal
MCA bifurcations. Distal MCA branches perfused and symmetric.

Posterior circulation: Atheromatous plaque within the mid right V4
segment with short-segment mild-to-moderate stenosis (series 7,
image 165). Right PICA patent at its origin. The left vertebral
artery essentially occludes as it courses into the cranial vault,
and remains occluded to the vertebrobasilar junction. Left PICA not
seen. Basilar somewhat diminutive but patent to its distal aspect
without stenosis. Superior cerebellar arteries patent at their
origins. Left PCA supplied via the basilar. Right PCA supplied via a
hypoplastic right P1 segment and small right posterior communicating
artery. Both PCAs patent and well perfused or distal aspects.

Venous sinuses: Patent allowing for timing the contrast bolus.

Anatomic variants: As above.  No aneurysm.

Review of the MIP images confirms the above findings
IMPRESSION: 1. Diffuse narrowing and attenuation of the left V3 segment, with
subsequent occlusion of the left vertebral artery as it courses into
the cranial vault. Finding is age indeterminate, and could be
chronic in nature, although a possible acute dissection is difficult
to exclude, and could be considered in the correct clinical setting.
No visible raised dissection flap intraluminal thrombus, or
downstream embolic process.
2. Short-segment mild-to-moderate right V4 stenosis.
3. Atheromatous change within the carotid siphons with associated
mild multifocal narrowing.
4. Otherwise wide patency of the major arterial vasculature of the
neck.

Case discussed by telephone at the time of interpretation on
[DATE] at [DATE] to provider SARAVANAN.

## 2021-05-31 MED ORDER — SODIUM CHLORIDE 0.9 % IV BOLUS: 500.0000 mL | Freq: Once | INTRAVENOUS | Status: DC

## 2021-05-31 MED ORDER — MORPHINE SULFATE (PF) 2 MG/ML IV SOLN
1.0000 mg | Freq: Once | INTRAVENOUS | Status: AC
  Administered 2021-05-31: 1 mg via INTRAVENOUS
  Filled 2021-05-31: qty 1

## 2021-05-31 MED ORDER — TENECTEPLASE FOR STROKE
25.0000 mg | PACK | Freq: Once | INTRAVENOUS | Status: AC
  Administered 2021-05-31: 25 mg via INTRAVENOUS
  Filled 2021-05-31: qty 10

## 2021-05-31 MED ORDER — CLEVIDIPINE BUTYRATE 0.5 MG/ML IV EMUL: 0.0000 mg/h | INTRAVENOUS | Status: DC

## 2021-05-31 MED ORDER — SODIUM CHLORIDE 0.9 % IV SOLN: INTRAVENOUS | Status: AC

## 2021-05-31 MED ORDER — LABETALOL HCL 5 MG/ML IV SOLN
20.0000 mg | Freq: Once | INTRAVENOUS | Status: AC
  Administered 2021-05-31: 20 mg via INTRAVENOUS

## 2021-05-31 MED ORDER — CHLORHEXIDINE GLUCONATE CLOTH 2 % EX PADS
6.0000 | MEDICATED_PAD | Freq: Every day | CUTANEOUS | Status: DC
  Administered 2021-05-31: 6 via TOPICAL

## 2021-05-31 MED ORDER — IOHEXOL 350 MG/ML SOLN
100.0000 mL | Freq: Once | INTRAVENOUS | Status: AC | PRN
  Administered 2021-05-31: 100 mL via INTRAVENOUS

## 2021-05-31 MED ORDER — ONDANSETRON HCL 4 MG/2ML IJ SOLN
4.0000 mg | Freq: Once | INTRAMUSCULAR | Status: AC
  Administered 2021-05-31: 4 mg via INTRAVENOUS

## 2021-05-31 MED ORDER — SODIUM CHLORIDE 0.9 % IV BOLUS
1000.0000 mL | Freq: Once | INTRAVENOUS | Status: AC
Start: 2021-05-31 — End: 2021-05-31
  Administered 2021-05-31: 1000 mL via INTRAVENOUS

## 2021-05-31 MED ORDER — ATORVASTATIN CALCIUM 80 MG PO TABS
80.0000 mg | ORAL_TABLET | Freq: Every day | ORAL | Status: DC
  Administered 2021-05-31 – 2021-06-01 (×2): 80 mg via ORAL
  Filled 2021-05-31 (×2): qty 1

## 2021-05-31 MED ORDER — LABETALOL HCL 5 MG/ML IV SOLN
10.0000 mg | INTRAVENOUS | Status: DC | PRN
  Administered 2021-05-31: 10 mg via INTRAVENOUS
  Filled 2021-05-31: qty 4

## 2021-05-31 MED ORDER — SENNOSIDES-DOCUSATE SODIUM 8.6-50 MG PO TABS
1.0000 | ORAL_TABLET | Freq: Every evening | ORAL | Status: DC | PRN
  Filled 2021-05-31: qty 1

## 2021-05-31 MED ORDER — ACETAMINOPHEN 650 MG RE SUPP: 650.0000 mg | RECTAL | Status: DC | PRN

## 2021-05-31 MED ORDER — SODIUM CHLORIDE 0.9% FLUSH
3.0000 mL | Freq: Once | INTRAVENOUS | Status: AC
  Administered 2021-05-31: 3 mL via INTRAVENOUS

## 2021-05-31 MED ORDER — ACETAMINOPHEN 160 MG/5ML PO SOLN: 650.0000 mg | ORAL | Status: DC | PRN

## 2021-05-31 MED ORDER — STROKE: EARLY STAGES OF RECOVERY BOOK: Freq: Once | Status: DC

## 2021-05-31 MED ORDER — ONDANSETRON HCL 4 MG/2ML IJ SOLN
4.0000 mg | Freq: Four times a day (QID) | INTRAMUSCULAR | Status: DC | PRN
  Administered 2021-05-31 – 2021-06-01 (×3): 4 mg via INTRAVENOUS
  Filled 2021-05-31 (×3): qty 2

## 2021-05-31 MED ORDER — INSULIN ASPART 100 UNIT/ML IJ SOLN
0.0000 [IU] | INTRAMUSCULAR | Status: DC
  Administered 2021-05-31 (×3): 3 [IU] via SUBCUTANEOUS
  Administered 2021-05-31: 2 [IU] via SUBCUTANEOUS
  Administered 2021-05-31: 5 [IU] via SUBCUTANEOUS
  Administered 2021-05-31: 3 [IU] via SUBCUTANEOUS
  Administered 2021-06-01: 2 [IU] via SUBCUTANEOUS
  Administered 2021-06-01: 3 [IU] via SUBCUTANEOUS
  Administered 2021-06-01: 5 [IU] via SUBCUTANEOUS
  Administered 2021-06-01: 3 [IU] via SUBCUTANEOUS
  Administered 2021-06-01: 2 [IU] via SUBCUTANEOUS
  Administered 2021-06-01 – 2021-06-02 (×4): 3 [IU] via SUBCUTANEOUS

## 2021-05-31 MED ORDER — ACETAMINOPHEN 325 MG PO TABS: 650.0000 mg | ORAL_TABLET | ORAL | Status: DC | PRN

## 2021-05-31 MED ORDER — MORPHINE SULFATE (PF) 2 MG/ML IV SOLN
2.0000 mg | Freq: Once | INTRAVENOUS | Status: AC
  Administered 2021-05-31: 2 mg via INTRAVENOUS
  Filled 2021-05-31: qty 1

## 2021-05-31 MED ORDER — PANTOPRAZOLE SODIUM 40 MG IV SOLR
40.0000 mg | Freq: Every day | INTRAVENOUS | Status: DC
  Administered 2021-05-31 (×2): 40 mg via INTRAVENOUS
  Filled 2021-05-31 (×2): qty 10

## 2021-05-31 NOTE — ED Triage Notes (Signed)
Pt arrived via [facility] w c/o headache that began approx 2230 then got dizzy and fell at approx 2330 with left sided weakness.  ?

## 2021-05-31 NOTE — Progress Notes (Signed)
PHARMACIST CODE STROKE RESPONSE ? ?Notified to mix TNK at 0044 by Dr. Audrea Muscat ?Delivered TNK to RN at 0047 ? ?TNK dose = 25 mg IV over 5 seconds ? ?Issues/delays encountered (if applicable): none ? ?Frank Cuevas Frank Cuevas ?05/31/21 12:55 AM  ?

## 2021-05-31 NOTE — Progress Notes (Addendum)
STROKE TEAM PROGRESS NOTE   INTERVAL HISTORY Patient is seen in his room with his wife at the bedside.  Yesterday, he was walking around when he experienced sudden onset dizziness, imbalance, headache, left-sided weakness, nausea, vomiting and slurred speech.  He fell to the ground and was brought to the ED.  He was evaluated for acute stroke and given TNK given his disabling symptoms.  Symptoms have improved since then with patient noting some residual difficulties in speaking.  His wife reports that his voice is more quiet and sounds different than it usually does.  He currently reports a left-sided headache which he states feels like a hammering pain.  Vitals:   05/31/21 0715 05/31/21 0800 05/31/21 0900 05/31/21 1000  BP: (!) 149/98 (!) 168/97 (!) 145/104 (!) 134/93  Pulse: 92 (!) 106 (!) 110 96  Resp: 15 13 19 19   Temp:  97.7 F (36.5 C)    TempSrc:  Oral    SpO2: 96% 92% 96% 94%  Weight:       CBC:  Recent Labs  Lab 05/31/21 0025 05/31/21 0027  WBC 10.7*  --   NEUTROABS 8.6*  --   HGB 15.5 16.7  HCT 46.3 49.0  MCV 86.1  --   PLT 240  --    Basic Metabolic Panel:  Recent Labs  Lab 05/31/21 0025 05/31/21 0027  NA 140 142  K 3.1* 3.1*  CL 106 105  CO2 21*  --   GLUCOSE 246* 250*  BUN 13 15  CREATININE 0.69 0.50*  CALCIUM 9.2  --    Lipid Panel:  Recent Labs  Lab 05/31/21 0646  CHOL 247*  TRIG 37  HDL 44  CHOLHDL 5.6  VLDL 7  LDLCALC 196*   HgbA1c:  Recent Labs  Lab 05/31/21 0646  HGBA1C 9.5*   Urine Drug Screen: No results for input(s): LABOPIA, COCAINSCRNUR, LABBENZ, AMPHETMU, THCU, LABBARB in the last 168 hours.  Alcohol Level No results for input(s): ETH in the last 168 hours.  IMAGING past 24 hours CT HEAD WO CONTRAST ( )  Result Date: 05/31/2021 CLINICAL DATA:  51 year old male with sudden severe headache. Code stroke presentation 0033 hours today. 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 EXAM: CT HEAD WITHOUT CONTRAST TECHNIQUE:  Contiguous axial images were obtained from the base of the skull through the vertex without intravenous contrast. RADIATION DOSE REDUCTION: This exam was performed according to the departmental dose-optimization program which includes automated exposure control, adjustment of the mA and/or kV according to patient size and/or use of iterative reconstruction technique. COMPARISON:  Head CT and CTA head and neck earlier today since 0033 hours. FINDINGS: Brain: Normal cerebral volume. Subtle new hypodensity left cerebellum PICA territory (coronal image 59 now versus series 5, image 60 on the initial head CT). No associated hemorrhage or mass effect. Elsewhere gray-white matter differentiation appears stable. No other acute or evolving infarct identified. No midline shift, ventriculomegaly, mass effect, evidence of mass lesion, or intracranial hemorrhage identified. Vascular: Calcified atherosclerosis at the skull base. Mild if any residual intravascular contrast. Skull: No acute osseous abnormality identified. Sinuses/Orbits: Visualized paranasal sinuses and mastoids are stable and well aerated. Other: Visualized orbits and scalp soft tissues are within normal limits. IMPRESSION: 1. Head CT appearance now highly suspicious for evolving Left PICA infarct. No hemorrhage or mass effect. 2. Elsewhere stable and negative CT appearance of the brain. 3. These results were communicated to Dr. Derry Lory at 5:48 am on 05/31/2021 by text page via the [facility]  messaging system. Electronically Signed   By: Odessa Fleming M.D.   On: 05/31/2021 05:55   CT HEAD CODE STROKE WO CONTRAST  Result Date: 05/31/2021 CLINICAL DATA:  Code stroke. Initial evaluation for neuro deficit, stroke suspected. EXAM: CT HEAD WITHOUT CONTRAST TECHNIQUE: Contiguous axial images were obtained from the base of the skull through the vertex without intravenous contrast. RADIATION DOSE REDUCTION: This exam was performed according to the departmental dose-optimization  program which includes automated exposure control, adjustment of the mA and/or kV according to patient size and/or use of iterative reconstruction technique. COMPARISON:  None available. FINDINGS: Brain: Cerebral volume within normal limits. No acute intracranial hemorrhage. No acute large vessel territory infarct. No mass lesion, mass effect or midline shift. No hydrocephalus or extra-axial fluid collection. Vascular: No hyperdense vessel. Calcified atherosclerosis present at the skull base. Skull: Scalp soft tissues and calvarium within normal limits. Sinuses/Orbits: Globes and orbital soft tissues within normal limits. Mild mucosal thickening noted within the ethmoidal air cells. Small amount of relating secretions noted within the nasopharynx. Mastoid air cells are clear. Other: None. ASPECTS [facility] Stroke Program Early CT Score) - Ganglionic level infarction (caudate, lentiform nuclei, internal capsule, insula, M1-M3 cortex): 7 - Supraganglionic infarction (M4-M6 cortex): 3 Total score (0-10 with 10 being normal): 10 IMPRESSION: 1. Negative head CT.  No acute intracranial abnormality. 2. ASPECTS is 10. These results were communicated to Dr. Derry Lory at 12:42 am on 05/31/2021 by text page via the [facility] messaging system. Electronically Signed   By: Rise Mu M.D.   On: 05/31/2021 00:44   CT ANGIO HEAD NECK W WO CM (CODE STROKE)  Result Date: 05/31/2021 CLINICAL DATA:  Initial evaluation for neuro deficit, stroke suspected. EXAM: CT ANGIOGRAPHY HEAD AND NECK TECHNIQUE: Multidetector CT imaging of the head and neck was performed using the standard protocol during bolus administration of intravenous contrast. Multiplanar CT image reconstructions and MIPs were obtained to evaluate the vascular anatomy. Carotid stenosis measurements (when applicable) are obtained utilizing NASCET criteria, using the distal internal carotid diameter as the denominator. RADIATION DOSE REDUCTION: This exam was performed  according to the departmental dose-optimization program which includes automated exposure control, adjustment of the mA and/or kV according to patient size and/or use of iterative reconstruction technique. CONTRAST:  OMNIPAQUE IOHEXOL 350 MG/ML SOLN COMPARISON:  Prior head CT from earlier the same [place]. FINDINGS: CTA NECK FINDINGS Aortic arch: Visualized aortic arch normal caliber with normal branch pattern. No stenosis about the origin the great vessels. Right carotid system: Right common and internal carotid arteries widely patent without stenosis, dissection or occlusion. Left carotid system: Diffuse intimal thickening seen within the left CCA with no more than mild narrowing. No significant atheromatous disease about the left carotid bulb. Left ICA patent distally without stenosis or dissection. Vertebral arteries: Both vertebral arteries arise from the subclavian arteries. No proximal subclavian artery stenosis. Right vertebral artery dominant and widely patent without stenosis or dissection. On the left, the left V1 and V2 segments are mildly irregular but remain patent without stenosis. There is diffuse narrowing, attenuation, and irregularity of the left V3 segment just prior to the skull base, age indeterminate (series 7, images 183, 187). No visible raised dissection flap or intraluminal thrombus. There is secondary moderate diffuse narrowing of the left vertebral artery at this level. Skeleton: No discrete or worrisome osseous lesions. Other neck: No other acute soft tissue abnormality within the neck. Upper chest: Scattered atelectatic changes noted within the visualized lungs. Visualized upper chest  demonstrates no other acute finding. Review of the MIP images confirms the above findings CTA HEAD FINDINGS Anterior circulation: Petrous segments patent bilaterally. Atheromatous change within the carotid siphons with associated mild multifocal narrowing. A1 segments patent bilaterally. Normal anterior  communicating artery complex. Both ACAs patent to their distal aspects without stenosis. No M1 stenosis or occlusion. Normal MCA bifurcations. Distal MCA branches perfused and symmetric. Posterior circulation: Atheromatous plaque within the mid right V4 segment with short-segment mild-to-moderate stenosis (series 7, image 165). Right PICA patent at its origin. The left vertebral artery essentially occludes as it courses into the cranial vault, and remains occluded to the vertebrobasilar junction. Left PICA not seen. Basilar somewhat diminutive but patent to its distal aspect without stenosis. Superior cerebellar arteries patent at their origins. Left PCA supplied via the basilar. Right PCA supplied via a hypoplastic right P1 segment and small right posterior communicating artery. Both PCAs patent and well perfused or distal aspects. Venous sinuses: Patent allowing for timing the contrast bolus. Anatomic variants: As above.  No aneurysm. Review of the MIP images confirms the above findings IMPRESSION: 1. Diffuse narrowing and attenuation of the left V3 segment, with subsequent occlusion of the left vertebral artery as it courses into the cranial vault. Finding is age indeterminate, and could be chronic in nature, although a possible acute dissection is difficult to exclude, and could be considered in the correct clinical setting. No visible raised dissection flap intraluminal thrombus, or downstream embolic process. 2. 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 3. Atheromatous change within the carotid siphons with associated mild multifocal narrowing. 4. Otherwise wide patency of the major arterial vasculature of the neck. Case discussed by telephone at the time of interpretation on 05/31/2021 at 12:50 am to provider [facility] Electronically Signed   By: Rise Mu M.D.   On: 05/31/2021 01:25    PHYSICAL EXAM General:  Alert, well-developed, well-nourished patient in no acute  distress Respiratory:  Regular, unlabored respirations on room air  NEURO:  Mental Status: AA&Ox3  Speech/Language: speech is hoarse with mild dysarthria.  Naming, repetition, fluency, and comprehension intact.  Cranial Nerves:  II: PERRL. Visual fields full.  III, IV, VI: EOMI. Eyelids elevate symmetrically.  V: Sensation is intact to light touch and symmetrical to face.  VII: Smile is symmetrical.   VIII: hearing intact to voice. IX, X: Phonation is normal.  UE:[id number] shrug 5/5. XII: tongue is midline without fasciculations. Motor: 5/5 strength to all muscle groups tested.  Sensation- Intact to light touch bilaterally.  Coordination: FTN intact bilaterally Gait- deferred   ASSESSMENT/PLAN Mr. Nilmar Farabaugh is a 51 y.o. male with history of T2DM presenting with sudden onset dizziness, imbalance, headache, left-sided weakness, nausea, vomiting and slurred speech.  He fell to the ground and was brought to the ED.  He was evaluated for acute stroke and given TNK given his disabling symptoms.  Symptoms have improved since then with patient noting some residual difficulties in speaking.  His wife reports that his voice is more quiet and sounds different than it usually does.  Stroke:  right MCA infarct likely secondary due to thrombosis source Code Stroke CT head No acute abnormality. ASPECTS 10.    CTA head & neck Diffuse narrowing of left V3 segment with occlusion of left vertebral artery, age indeterminate, atheromatous changes in carotid siphons MRI  pending 2D Echo pending LDL 196 HgbA1c 9.5 VTE prophylaxis - SCDs    Diet   Diet NPO time specified   No antithrombotic prior  to admission, now on No antithrombotic as he is <24 hours post TNK Therapy recommendations:  pending Disposition:  pending  Hypertension Home meds:  none Stable Keep BP <180/105 Long-term BP goal normotensive  Hyperlipidemia Home meds:  none LDL 196, goal < 70 Add atorvastatin 80 mg daily  Continue  statin at discharge  Diabetes type II Uncontrolled Home meds:  Jardiance 25 mg daily, pioglitazone 30 mg daily HgbA1c 9.5, goal < 7.0 CBGs Recent Labs    05/31/21 0307 05/31/21 0838 05/31/21 1058  GLUCAP 197* 175* 200*    SSI  Other Stroke Risk Factors none  Other Active Problems none  [id number]  51 year old gentleman with history of diabetes with sudden onset dizziness being off balance and left-sided weakness.  He received TNK.  An episode of headache overnight, stat head CT was repeated and was negative.  He tells me that he gets worsening speech with a headache that seems to improve with morphine while in the hospital.  He has no history of headaches or migraines in the past.  He is almost back to baseline suffers speech which is slow and slightly dysarthric.  We will get an MRI tonight after 24 hours post TNK.  Continue post TNK protocol while in ICU.  Dr. Viviann Spare evaluated pt independently, reviewed imaging, chart, labs. Discussed and formulated plan with the APP. Please see APP note above for details.   This patient is critically ill due to stroke s/p tPA and at significant risk of neurological worsening, death form heart failure, respiratory failure, recurrent stroke, bleeding from [facility], seizure, sepsis. This patient's care requires constant monitoring of vital signs, hemodynamics, respiratory and cardiac monitoring, review of multiple databases, neurological assessment, discussion with family, other specialists and medical decision making of high complexity. I spent 35 minutes of neurocritical care time in the care of this patient.   Yazleemar Strassner,MD     To contact Stroke Continuity provider, please refer to [email or web address]. After hours, contact General Neurology

## 2021-05-31 NOTE — ED Provider Notes (Signed)
?[facility] EMERGENCY DEPARTMENT ?Provider Note ? ? ?CSN: [id number] ?Arrival date & time: 05/31/21  0021 ? ?  ? ?History ? ?No chief complaint on file. ? ? ?Bodi Rynders is a 51 y.o. male. ? ?The history is provided by the EMS personnel and the patient.  ?He was brought in by ambulance as a code stroke.  He was last known well at 10 PM and is reported to have gotten dizzy and fallen.  EMS noted that he was dragging his left leg and had significant weakness of his left leg and speech was slurred.  He also had been complaining of a headache. ?  ?Home Medications ?Prior to Admission medications   ?Not on File  ?   ? ?Allergies    ?Patient has no allergy information on record.   ? ?Review of Systems   ?Review of Systems  ?All other systems reviewed and are negative. ? ?Physical Exam ?Updated Vital Signs ?BP (!) 170/107   Pulse 79   Temp 97.6 ?F (36.4 ?C)   Resp 19   Wt 117.8 kg   SpO2 96%  ?Physical Exam ?Vitals and nursing note reviewed.  ?51 year old male, actively vomiting, but in no acute distress. Vital signs are significant for elevated blood pressure. Oxygen saturation is 96%, which is normal. ?Head is normocephalic and atraumatic. PERRLA, EOMI. Oropharynx is clear.  Rotary nystagmus is noted. ?Neck is nontender and supple without adenopathy. ?Back is nontender and there is no CVA tenderness. ?Lungs are clear without rales, wheezes, or rhonchi. ?Chest is nontender. ?Heart has regular rate and rhythm without murmur. ?Abdomen is soft, flat, nontender without masses or hepatosplenomegaly and peristalsis is normoactive. ?Extremities have no cyanosis or edema, full range of motion is present. ?Skin is warm and dry without rash. ?Neurologic: Awake and alert and oriented, speech is mildly dysarthric, cranial nerves are intact except for presence of nystagmus, there is no pronator drift.  Strength is 5/5 in all 4 extremities.  Finger-nose and rapid alternating testing are normal. ? ?ED Results /  Procedures / Treatments   ?Labs ?(all labs ordered are listed, but only abnormal results are displayed) ?Labs Reviewed  ?CBC - Abnormal; Notable for the following components:  ?    Result Value  ? WBC 10.7 (*)   ? All other components within normal limits  ?DIFFERENTIAL - Abnormal; Notable for the following components:  ? Neutro Abs 8.6 (*)   ? All other components within normal limits  ?COMPREHENSIVE METABOLIC PANEL - Abnormal; Notable for the following components:  ? Potassium 3.1 (*)   ? CO2 21 (*)   ? Glucose, Bld 246 (*)   ? All other components within normal limits  ?I-STAT CHEM 8, ED - Abnormal; Notable for the following components:  ? Potassium 3.1 (*)   ? Creatinine, Ser 0.50 (*)   ? Glucose, Bld 250 (*)   ? Calcium, Ion 1.10 (*)   ? All other components within normal limits  ?CBG MONITORING, ED - Abnormal; Notable for the following components:  ? Glucose-Capillary 222 (*)   ? All other components within normal limits  ?PROTIME-INR  ?APTT  ?HIV ANTIBODY (ROUTINE TESTING W REFLEX)  ?HEMOGLOBIN A1C  ?LIPID PANEL  ? ? ?EKG ?None ? ?Radiology ?CT HEAD CODE STROKE WO CONTRAST ? ?Result Date: 05/31/2021 ?CLINICAL DATA:  Code stroke. Initial evaluation for neuro deficit, stroke suspected. EXAM: CT HEAD WITHOUT CONTRAST TECHNIQUE: Contiguous axial images were obtained from the base of the skull  through the vertex without intravenous contrast. RADIATION DOSE REDUCTION: This exam was performed according to the departmental dose-optimization program which includes automated exposure control, adjustment of the mA and/or kV according to patient size and/or use of iterative reconstruction technique. COMPARISON:  None available. FINDINGS: Brain: Cerebral volume within normal limits. No acute intracranial hemorrhage. No acute large vessel territory infarct. No mass lesion, mass effect or midline shift. No hydrocephalus or extra-axial fluid collection. Vascular: No hyperdense vessel. Calcified atherosclerosis present at the  skull base. Skull: Scalp soft tissues and calvarium within normal limits. Sinuses/Orbits: Globes and orbital soft tissues within normal limits. Mild mucosal thickening noted within the ethmoidal air cells. Small amount of relating secretions noted within the nasopharynx. Mastoid air cells are clear. Other: None. ASPECTS [facility]([place] Stroke Program Early CT Score) - Ganglionic level infarction (caudate, lentiform nuclei, internal capsule, insula, M1-M3 cortex): 7 - Supraganglionic infarction (M4-M6 cortex): 3 Total score (0-10 with 10 being normal): 10 IMPRESSION: 1. Negative head CT.  No acute intracranial abnormality. 2. ASPECTS is 10. These results were communicated to Dr. Derry LoryKhaliqdina at 12:42 am on 05/31/2021 by text page via the [facility] messaging system. Electronically Signed   By: Rise MuBenjamin  McClintock M.D.   On: 05/31/2021 00:44  ? ?CT ANGIO HEAD NECK W WO CM (CODE STROKE) ? ?Result Date: 05/31/2021 ?CLINICAL DATA:  Initial evaluation for neuro deficit, stroke suspected. EXAM: CT ANGIOGRAPHY HEAD AND NECK TECHNIQUE: Multidetector CT imaging of the head and neck was performed using the standard protocol during bolus administration of intravenous contrast. Multiplanar CT image reconstructions and MIPs were obtained to evaluate the vascular anatomy. Carotid stenosis measurements (when applicable) are obtained utilizing NASCET criteria, using the distal internal carotid diameter as the denominator. RADIATION DOSE REDUCTION: This exam was performed according to the departmental dose-optimization program which includes automated exposure control, adjustment of the mA and/or kV according to patient size and/or use of iterative reconstruction technique. CONTRAST:  100mL OMNIPAQUE IOHEXOL 350 MG/ML SOLN COMPARISON:  Prior head CT from earlier the same [place]. FINDINGS: CTA NECK FINDINGS Aortic arch: Visualized aortic arch normal caliber with normal branch pattern. No stenosis about the origin the great vessels. Right carotid system:  Right common and internal carotid arteries widely patent without stenosis, dissection or occlusion. Left carotid system: Diffuse intimal thickening seen within the left CCA with no more than mild narrowing. No significant atheromatous disease about the left carotid bulb. Left ICA patent distally without stenosis or dissection. Vertebral arteries: Both vertebral arteries arise from the subclavian arteries. No proximal subclavian artery stenosis. Right vertebral artery dominant and widely patent without stenosis or dissection. On the left, the left V1 and V2 segments are mildly irregular but remain patent without stenosis. There is diffuse narrowing, attenuation, and irregularity of the left V3 segment just prior to the skull base, age indeterminate (series 7, images 183, 187). No visible raised dissection flap or intraluminal thrombus. There is secondary moderate diffuse narrowing of the left vertebral artery at this level. Skeleton: No discrete or worrisome osseous lesions. Other neck: No other acute soft tissue abnormality within the neck. Upper chest: Scattered atelectatic changes noted within the visualized lungs. Visualized upper chest demonstrates no other acute finding. Review of the MIP images confirms the above findings CTA HEAD FINDINGS Anterior circulation: Petrous segments patent bilaterally. Atheromatous change within the carotid siphons with associated mild multifocal narrowing. A1 segments patent bilaterally. Normal anterior communicating artery complex. Both ACAs patent to their distal aspects without stenosis. No M1 stenosis or occlusion.  Normal MCA bifurcations. Distal MCA branches perfused and symmetric. Posterior circulation: Atheromatous plaque within the mid right V4 segment with short-segment mild-to-moderate stenosis (series 7, image 165). Right PICA patent at its origin. The left vertebral artery essentially occludes as it courses into the cranial vault, and remains occluded to the  vertebrobasilar junction. Left PICA not seen. Basilar somewhat diminutive but patent to its distal aspect without stenosis. Superior cerebellar arteries patent at their origins. Left PCA supplied via the basilar. Right PCA

## 2021-05-31 NOTE — Code Documentation (Signed)
Responded to Code Stroke called at 0014 for L sided weakness, L facial droop, and slurred speech, [id number]. Pt arrived at 0021. Per EMS, pt developed a headache at 2200 followed by L sided weakness, L facial droop, slurred speech, and difficulty swallowing at 2350. CBG-250, NIH-3 of L facial droop and slurred speech, CT head negative for acute changes, CTA-no LVO. While pt was in CT, he was given 4mg  zofran for N/V and 20mg  labetolol for BP-187/114. TNK given at 0047 once BP decreased to 169/97. Plan to admit to ICU. ?

## 2021-05-31 NOTE — H&P (Signed)
NEUROLOGY CONSULTATION NOTE  ? ?Date of service: May 31, 2021 ?Patient Name: Frank Cuevas ?MRN:  [id number] ?DOB:  [birth year] ?_ _ _   _ __   _ __ _ _  __ __   _ __   __ _ ? ?History of Present Illness  ?Frank Cuevas is a 51 y.o. male with PMH significant for diabetes type 2 non-insulin-dependent who presents with sudden onset dizziness which she describes as spinning along with feeling off balance, left-sided weakness, nausea, vomiting, slurred speech. He reports headache that started a couple mins after this. ? ?He was walking at 2200 on 05/30/2021 when he had sudden onset symptoms.  He fell down to the ground but did not hit his head and did not lose consciousness.  He denies any prior history of similar symptoms.  Significant other attempted to get him into the car and drive him to the hospital but was pulled over and the police department called EMS and he was brought in as a code stroke. ? ?On arrival, noted to have left facial droop, significantly slurred speech, his left leg appears weaker but then his facial droop significantly improved and his left leg weakness and ataxia had resolved. ? ?No neck pain, no recent visits to chiropractor, no accidents. No hx of ICH, no brain mets or cancer, no recent spinal or CNS surgery, no hx of aortic dissection, no chest pain. ? ?Initial imaging with CT head without contrast with no acute intracranial abnormalities, aspects of 10.  Despite improvement of L sided weakness, rest of his symptoms were persistent and did not improve. He was offered TNKase due to persistent trouble swallowing with pooling of saliva in his mouth, significant slurring of his speech and an inability to cough.  I discussed with Frank Cuevas the risks and benefits of TNKase.  He felt that his current symptoms are significantly disabling and "he feels like he is drunk despite no alcohol intake and he cannot live this way". He opted to take tNKASE rather than wait at this time. ? ? ?Delay to tNKASE: ?- Had to give  him Zofran prior to [facility] due to vomiting and dry heaving. ?- Had to be given Labetalol to bring BP down prior to tNKASE. ? ?LKW: 2200 on4/1/23. ?mRS: 0 ?tNKASE: yes offered and discussed risks and benefits and patient agreed to tNKASE. ?Thrombectomy: not offered 2/2 no LVO. ?NIHSS components Score: Comment  ?1a Level of Conscious 0[x]  1[]  2[]  3[]      ?1b LOC Questions 0[x]  1[]  2[]       ?1c LOC Commands 0[x]  1[]  2[]       ?2 Best Gaze 0[x]  1[]  2[]       ?3 Visual 0[x]  1[]  2[]  3[]      ?4 Facial Palsy 0[]  1[x]  2[]  3[]      ?5a Motor Arm - left 0[x]  1[]  2[]  3[]  4[]  UN[]    ?5b Motor Arm - Right 0[x]  1[]  2[]  3[]  4[]  UN[]    ?6a Motor Leg - Left 0[x]  1[]  2[]  3[]  4[]  UN[]    ?6b Motor Leg - Right 0[x]  1[]  2[]  3[]  4[]  UN[]    ?7 Limb Ataxia 0[x]  1[]  2[]  3[]  UN[]     ?8 Sensory 0[x]  1[]  2[]  UN[]      ?9 Best Language 0[x]  1[]  2[]  3[]      ?10 Dysarthria 0[]  1[]  2[x]  UN[]      ?11 Extinct. and Inattention 0[x]  1[]  2[]       ?TOTAL: 3   ?  ?ROS  ? ?Constitutional Denies weight loss, fever  and chills.   ?HEENT Denies changes in vision and hearing.   ?Respiratory Denies SOB, unable to cough.   ?CV Denies palpitations and CP   ?GI Denies abdominal pain, nausea, vomiting and diarrhea.   ?GU Denies dysuria and urinary frequency.   ?MSK Denies myalgia and joint pain.   ?Skin Denies rash and pruritus.  ?Neurological Endorses headache behind L eye but no syncope.  ?Psychiatric Denies recent changes in mood. Denies anxiety and depression.   ? ?Past History  ? ?Past Medical History:  ?Diagnosis Date  ? Diabetes mellitus without complication ([place])   ? ?Past Surgical History:  ?Procedure Laterality Date  ? NO PAST SURGERIES    ? ?No family history on file. ?Social History  ? ?Socioeconomic History  ? Marital status: Married  ?  Spouse name: Not on file  ? Number of children: Not on file  ? Years of education: Not on file  ? Highest education level: Not on file  ?Occupational History  ? Not on file  ?Tobacco Use  ? Smoking status: Never  ? Smokeless  tobacco: Never  ?Substance and Sexual Activity  ? Alcohol use: Not on file  ? Drug use: Never  ? Sexual activity: Not on file  ?Other Topics Concern  ? Not on file  ?Social History Narrative  ? Not on file  ? ?Social Determinants of Health  ? ?Financial Resource Strain: Not on file  ?Food Insecurity: Not on file  ?Transportation Needs: Not on file  ?Physical Activity: Not on file  ?Stress: Not on file  ?Social Connections: Not on file  ? ?Not on File ? ?Medications  ?(Not in a hospital admission) ?  ? ?Vitals  ? ?Vitals:  ? 05/31/21 0043 05/31/21 0045 05/31/21 0058 05/31/21 0100  ?BP:  (!) 169/97  (!) 176/102  ?Pulse:  80 64 61  ?Resp:  16  16  ?Temp:    (!) 97.4 ?F (36.3 ?C)  ?SpO2: (!) 85% 98% 97% 97%  ?Weight:      ?  ? ?There is no height or weight on file to calculate BMI. ? ?Physical Exam  ? ?General: Laying comfortably in bed; appears a little uncomfortable and nauseous. ?HENT: Normal oropharynx and mucosa. Normal external appearance of ears and nose.  ?Neck: Supple, no pain or tenderness  ?CV: No JVD. No peripheral edema.  ?Pulmonary: Symmetric Chest rise. Normal respiratory effort.  ?Abdomen: Soft to touch, non-tender.  ?Ext: No cyanosis, edema, or deformity  ?Skin: No rash. Normal palpation of skin.   ?Musculoskeletal: Normal digits and nails by inspection. No clubbing.  ? ?Neurologic Examination  ?Mental status/Cognition: Alert, oriented to self, place, month and year, good attention.  ?Speech/language: significantly dysarthric speech, fluent, comprehension intact, object naming intact, repetition intact.  ?Cranial nerves:  ? CN II Pupils equal and reactive to light, no VF deficits   ? CN III,IV,VI EOM intact, no gaze preference or deviation, no nystagmus   ? CN V normal sensation in V1, V2, and V3 segments bilaterally   ? CN VII Mild L facial droop  ? CN VIII normal hearing to speech   ? CN IX & X normal palatal elevation, no uvular deviation. Trouble swallowing, trouble initiating a cough  ? CN XI 5/5  head turn and 5/5 shoulder shrug bilaterally   ? CN XII midline tongue protrusion   ? ?Motor:  ?Muscle bulk: normal, tone none, pronator drift none tremor none ?Mvmt Root Nerve  Muscle Right Left Comments  ?SA  C5/6 Ax Deltoid 5 5   ?EF C5/6 Mc Biceps 5 5   ?EE C6/7/8 Rad Triceps 5 5   ?WF C6/7 Med FCR     ?WE C7/8 PIN ECU     ?F Ab C8/T1 U ADM/FDI 5 5   ?HF L1/2/3 Fem Illopsoas 5 5   ?KE L2/3/4 Fem Quad 5 5   ?DF L4/5 D Peron Tib Ant 5 5   ?PF S1/2 Tibial Grc/Sol 5 5   ? ?Reflexes: ? Right Left Comments  ?Pectoralis     ? Biceps (C5/6) 2 2   ?Brachioradialis (C5/6) 2 2   ? Triceps (C6/7) 2 2   ? Patellar (L3/4) 2 2   ? Achilles (S1)     ? Hoffman     ? Plantar     ?Jaw jerk   ? ?Sensation: ? Light touch Intact throughout  ? Pin prick   ? Temperature   ? Vibration   ?Proprioception   ? ?Coordination/Complex Motor:  ?- Finger to Nose intact BL ?- Heel to shin intact BL ?- Rapid alternating movement mildly slow in LUE compared to RUE. ?- Gait: Deferred at this time due to patient safety. ? ?Labs  ? ?CBC:  ?Recent Labs  ?Lab 05/31/21 ?0025 05/31/21 ?[id number]  ?WBC 10.7*  --   ?NEUTROABS 8.6*  --   ?HGB 15.5 16.7  ?HCT 46.3 49.0  ?MCV 86.1  --   ?PLT 240  --   ? ? ?Basic Metabolic Panel:  ?Lab Results  ?Component Value Date  ? NA 142 05/31/2021  ? K 3.1 (L) 05/31/2021  ? GLUCOSE 250 (H) 05/31/2021  ? BUN 15 05/31/2021  ? CREATININE 0.50 (L) 05/31/2021  ? ?Lipid Panel: No results found for: [place] ?HgbA1c: No results found for: HGBA1C ?Urine Drug Screen: No results found for: LABOPIA, COCAINSCRNUR, [place], [place], THCU, LABBARB  ?Alcohol Level No results found for: ETH ? ?CT Head without contrast(Personally reviewed): ?CTH was negative for a large hypodensity concerning for a large territory infarct or hyperdensity concerning for an ICH ? ?CT angio Head and Neck with contrast(Personally reviewed): ?No LVO, age indeterminate L vert occlusion vs dissection which I think is likely chronic. It also seems that the left vert is  non dominant. ? ?MRI Brain: ?pending ? ? ?Impression  ? ?Frank Cuevas is a 51 y.o. male with PMH significant for DM2 who presents with udden onset dizziness which she describes as spinning along with feeling

## 2021-05-31 NOTE — Progress Notes (Signed)
PT Cancellation Note ? ?Patient Details ?Name: Frank Cuevas ?MRN: [id number] ?DOB: Jan 18, 1971 ? ? ?Cancelled Treatment:    Reason Eval/Treat Not Completed: Active bedrest order; RN states will not be out of 24 hour window post TNK till tomorrow. ? ? ?Elray Mcgregor ?05/31/2021, 10:54 AM ?Sheran Lawless, PT ?Acute Rehabilitation Services ?Pager:[phone] ?Office:[phone] ?05/31/2021 ? ?

## 2021-05-31 NOTE — Progress Notes (Signed)
Reports Left sided headache that is new. Will get STAT CTH. ? ?Donnetta Simpers ?[facility] ?Pager Number [phone] ?

## 2021-06-01 ENCOUNTER — Inpatient Hospital Stay (HOSPITAL_COMMUNITY): Payer: BC Managed Care – PPO

## 2021-06-01 DIAGNOSIS — I639 Cerebral infarction, unspecified: Secondary | ICD-10-CM | POA: Diagnosis not present

## 2021-06-01 DIAGNOSIS — I6389 Other cerebral infarction: Secondary | ICD-10-CM

## 2021-06-01 LAB — BASIC METABOLIC PANEL
Anion gap: 12 (ref 5–15)
BUN: 15 mg/dL (ref 6–20)
CO2: 19 mmol/L — ABNORMAL LOW (ref 22–32)
Calcium: 8.8 mg/dL — ABNORMAL LOW (ref 8.9–10.3)
Chloride: 107 mmol/L (ref 98–111)
Creatinine, Ser: 0.69 mg/dL (ref 0.61–1.24)
GFR, Estimated: >60 mL/min (ref >60)
Glucose, Bld: 169 mg/dL — ABNORMAL HIGH (ref 70–99)
Potassium: 3.8 mmol/L (ref 3.5–5.1)
Sodium: 138 mmol/L (ref 135–145)

## 2021-06-01 LAB — CBC
HCT: 43.3 % (ref 39.0–52.0)
Hemoglobin: 13.9 g/dL (ref 13.0–17.0)
MCH: 28.7 pg (ref 26.0–34.0)
MCHC: 32.1 g/dL (ref 30.0–36.0)
MCV: 89.3 fL (ref 80.0–100.0)
Platelets: 234 10*3/uL (ref 150–400)
RBC: 4.85 MIL/uL (ref 4.22–5.81)
RDW: 13.4 % (ref 11.5–15.5)
WBC: 10.1 10*3/uL (ref 4.0–10.5)
nRBC: 0 % (ref 0.0–0.2)

## 2021-06-01 LAB — ECHOCARDIOGRAM COMPLETE BUBBLE STUDY
AR max vel: 5.79 cm2
AV Area VTI: 5.73 cm2
AV Area mean vel: 5.44 cm2
AV Mean grad: 3 mmHg
AV Peak grad: 5.6 mmHg
Ao pk vel: 1.18 m/s
Area-P 1/2: 4.21 cm2
Calc EF: 52.9 %
Single Plane A2C EF: 61.2 %
Single Plane A4C EF: 42.4 %

## 2021-06-01 LAB — GLUCOSE, CAPILLARY
Glucose-Capillary: 135 mg/dL — ABNORMAL HIGH (ref 70–99)
Glucose-Capillary: 139 mg/dL — ABNORMAL HIGH (ref 70–99)
Glucose-Capillary: 154 mg/dL — ABNORMAL HIGH (ref 70–99)
Glucose-Capillary: 155 mg/dL — ABNORMAL HIGH (ref 70–99)
Glucose-Capillary: 155 mg/dL — ABNORMAL HIGH (ref 70–99)
Glucose-Capillary: 164 mg/dL — ABNORMAL HIGH (ref 70–99)
Glucose-Capillary: 240 mg/dL — ABNORMAL HIGH (ref 70–99)

## 2021-06-01 MED ORDER — EZETIMIBE 10 MG PO TABS
10.0000 mg | ORAL_TABLET | Freq: Every day | ORAL | Status: DC
  Administered 2021-06-01: 10 mg via ORAL
  Filled 2021-06-01: qty 1

## 2021-06-01 MED ORDER — CLOPIDOGREL BISULFATE 75 MG PO TABS
75.0000 mg | ORAL_TABLET | Freq: Every day | ORAL | Status: DC
  Administered 2021-06-01: 75 mg via ORAL
  Filled 2021-06-01: qty 1

## 2021-06-01 MED ORDER — PANTOPRAZOLE SODIUM 40 MG PO TBEC
40.0000 mg | DELAYED_RELEASE_TABLET | Freq: Every day | ORAL | Status: DC
  Administered 2021-06-01: 40 mg via ORAL
  Filled 2021-06-01: qty 1

## 2021-06-01 MED ORDER — ASPIRIN EC 81 MG PO TBEC
81.0000 mg | DELAYED_RELEASE_TABLET | Freq: Every day | ORAL | Status: DC
  Administered 2021-06-01: 81 mg via ORAL
  Filled 2021-06-01: qty 1

## 2021-06-01 NOTE — Evaluation (Signed)
Physical Therapy Evaluation ?Patient Details ?Name: Frank Cuevas ?MRN: [id number] ?DOB: [birth year] ?Today's Date: 06/01/2021 ? ?History of Present Illness ? Frank Cuevas is a 51 y.o. male with who presents with sudden onset dizziness, left-sided weakness, nausea, vomiting, slurred speech.  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 TNK administered. PMH significant for diabetes type 2 non-insulin-dependent  ?Clinical Impression ? Patient presents with decreased mobility due to deficits listed in PT problem list.  Currently mobilizing in room with minguard A and cues for targeting technique.  Able to stand statically and perform head turns to two targets.  Mild sway and continued dizziness.  Previously independent working two jobs and completely independent.  Lives with his adult daughter and has a significant other who can also assist.  Feel he will benefit from skilled PT in the acute setting and from follow up outpatient PT to progress habituation and balance.    ?   ? ?Recommendations for follow up therapy are one component of a multi-disciplinary discharge planning process, led by the attending physician.  Recommendations may be updated based on patient status, additional functional criteria and insurance authorization. ? ?Follow Up Recommendations Outpatient PT (needs referral to outpatient in [place]) ? ?  ?Assistance Recommended at Discharge PRN  ?Patient can return home with the following ? Assist for transportation;Help with stairs or ramp for entrance;Direct supervision/assist for financial management;Direct supervision/assist for medications management;Assistance with cooking/housework;A little help with walking and/or transfers;A little help with bathing/dressing/bathroom ? ?  ?Equipment Recommendations None recommended by PT  ?Recommendations for Other Services ?    ?  ?Functional Status Assessment Patient has had a recent  decline in their functional status and demonstrates the ability to make significant improvements in function in a reasonable and predictable amount of time.  ? ?  ?Precautions / Restrictions Precautions ?Precautions: Fall ?Restrictions ?Weight Bearing Restrictions: No  ? ?  ? ?Mobility ? Bed Mobility ?Overal bed mobility: Needs Assistance ?  ?Rolling: Supervision ?Sidelying to sit: Supervision ?  ?  ?  ?General bed mobility comments: cues for visual target to compensate and decrease dizziness ?  ? ?Transfers ?Overall transfer level: Needs assistance ?Equipment used: None ?Transfers: Sit to/from Stand ?Sit to Stand: Supervision ?  ?  ?  ?  ?  ?General transfer comment: assist for safety, cues for visual targeting ?  ? ?Ambulation/Gait ?Ambulation/Gait assistance: Min guard ?Gait Distance (Feet): 40 Feet (& 10') ?Assistive device: None ?Gait Pattern/deviations: Step-through pattern, Decreased stride length, Shuffle, Wide base of support ?  ?  ?  ?General Gait Details: ambulated to door in room then back to bed, seated EOB and then around bed to recliner, cues throughout with turns especially for targeting; pt reports most dizziness/some nausea with stand to sit ? ?Stairs ?  ?  ?  ?  ?  ? ?Wheelchair Mobility ?  ? ?Modified Rankin (Stroke Patients Only) ?Modified Rankin (Stroke Patients Only) ?Pre-Morbid Rankin Score: No symptoms ?Modified Rankin: Moderately severe disability ? ?  ? ?Balance Overall balance assessment: Needs assistance ?Sitting-balance support: Feet supported ?Sitting balance-Leahy Scale: Good ?  ?  ?Standing balance support: No upper extremity supported ?Standing balance-Leahy Scale: Good ?Standing balance comment: standing and reaching to touch targets, then with head turns looking at two targets x 5 reps ?  ?  ?  ?  ?  ?  ?  ?  ?  ?  ?  ?   ? ? ? ?  Pertinent Vitals/Pain Pain Assessment ?Pain Assessment: No/denies pain  ? ? ?Home Living Family/patient expects to be discharged to:: Private  residence ?Living Arrangements: Children ?Available Help at Discharge: Available 24 hours/[place] ?Type of Home: House ?Home Access: Stairs to enter ?Entrance Stairs-Rails: Right ?Entrance Stairs-Number of Steps: 1 ?  ?Home Layout: One level ?Home Equipment: None ?Additional Comments: s.o. and family can work out 24/7 assist  ?  ?Prior Function Prior Level of Function : Independent/Modified Independent ?  ?  ?  ?  ?  ?  ?Mobility Comments: indep, no AD ?ADLs Comments: works 2 jobs, indep in all IADLs ?  ? ? ?Hand Dominance  ? Dominant Hand: Right ? ?  ?Extremity/Trunk Assessment  ? Upper Extremity Assessment ?Upper Extremity Assessment: Defer to OT evaluation ?  ? ?Lower Extremity Assessment ?Lower Extremity Assessment: Overall WFL for tasks assessed ?  ? ?   ?Communication  ? Communication: No difficulties  ?Cognition Arousal/Alertness: Awake/alert ?Behavior During Therapy: [facility] for tasks assessed/performed ?Overall Cognitive Status: Within Functional Limits for tasks assessed ?  ?  ?  ?  ?  ?  ?  ?  ?  ?  ?  ?  ?  ?  ?  ?  ?  ?  ?  ? ?  ?General Comments General comments (skin integrity, edema, etc.): Educated on therapy progression for habituation and recommendations for follow up outpatient PT ? ?  ?Exercises    ? ?Assessment/Plan  ?  ?PT Assessment Patient needs continued PT services  ?PT Problem List Decreased mobility;Decreased activity tolerance;Decreased balance;Impaired sensation ? ?   ?  ?PT Treatment Interventions DME instruction;Therapeutic activities;Gait training;Therapeutic exercise;Patient/family education;Balance training;Stair training;Functional mobility training   ? ?PT Goals (Current goals can be found in the Care Plan section)  ?Acute Rehab PT Goals ?Patient Stated Goal: to return to independent ?PT Goal Formulation: With patient ?Time For Goal Achievement: 06/15/21 ?Potential to Achieve Goals: Good ? ?  ?Frequency Min 4X/week ?  ? ? ?Co-evaluation   ?  ?  ?  ?  ? ? ?  ?AM-PAC PT "6 Clicks"  Mobility  ?Outcome Measure Help needed turning from your back to your side while in a flat bed without using bedrails?: A Little ?Help needed moving from lying on your back to sitting on the side of a flat bed without using bedrails?: A Little ?Help needed moving to and from a bed to a chair (including a wheelchair)?: A Little ?Help needed standing up from a chair using your arms (e.g., wheelchair or bedside chair)?: None ?Help needed to walk in hospital room?: A Little ?Help needed climbing 3-5 steps with a railing? : Total ?6 Click Score: 17 ? ?  ?End of Session   ?Activity Tolerance: Patient tolerated treatment well ?Patient left: in bed;with call bell/phone within reach;with family/visitor present ?  ?PT Visit Diagnosis: Other abnormalities of gait and mobility (R26.89);Other symptoms and signs involving the nervous system (R29.898);Dizziness and giddiness (R42) ?  ? ?Time: 1355-1415 ?PT Time Calculation (min) (ACUTE ONLY): 20 min ? ? ?Charges:   PT Evaluation ?$PT Eval Low Complexity: 1 Low ?  ?  ?   ? ? ?Sheran Lawless, PT ?Acute Rehabilitation Services ?Pager:[phone] ?Office:[phone] ?06/01/2021 ? ? ?Elray Mcgregor ?06/01/2021, 4:11 PM ? ?

## 2021-06-01 NOTE — Progress Notes (Signed)
OT Cancellation Note ? ?Patient Details ?Name: Frank Cuevas ?MRN: [id number] ?DOB: 1971-02-23 ? ? ?Cancelled Treatment:    Reason Eval/Treat Not Completed: Active bedrest order (pt continues to have active bedrest order; OT evaluation to f/u after activity orders are placed.) ? ?Wilfredo Canterbury A Ledell Codrington ?06/01/2021, 7:46 AM ?

## 2021-06-01 NOTE — TOC CAGE-AID Note (Signed)
Transition of Care (TOC) - CAGE-AID Screening ? ? ?Patient Details  ?Name: Frank Cuevas ?MRN: [id number] ?Date of Birth: 1971-02-17 ? ?Transition of Care (TOC) CM/SW Contact:    ?Londen Lorge C Tarpley-Carter, LCSWA ?Phone Number: ?06/01/2021, 8:39 AM ? ? ?Clinical Narrative: ?Pt is unable to participate in Cage Aid. ?Pt is currently critically ill.  CSW will assess at a better time. ? ?Insurance underwriter, MSW, LCSW-A ?Pronouns:  She/Her/Hers ?[facility]itions of [facility] ?Clinical Social Worker ?Direct Number:  [phone] ?[email or web address] ? ?CAGE-AID Screening: ?Substance Abuse Screening unable to be completed due to: : Patient unable to participate ? ?  ?  ?  ?  ?  ? ?Substance Abuse Education Offered: No ? ?  ? ? ? ? ? ? ?

## 2021-06-01 NOTE — Progress Notes (Addendum)
STROKE TEAM PROGRESS NOTE  ? ?INTERVAL HISTORY ?Patient is seen in his room with a friend at the bedside. Slight dizziness/off balance sitting on the side of the bed. To work with PT/OT today.  ?Noticed a slight double vision with his glasses on yesterday, voice is better than it was previously was ?MRI scan of the brain shows left medullary and PICA territory infarct.  CT angiogram shows left vertebral artery occlusion versus high-grade stenosis in the left V3 segment.  LDL cholesterol is elevated at 196 mg percent and hemoglobin A1c at 9.5 ?Vitals:  ? 06/01/21 0500 06/01/21 0600 06/01/21 0700 06/01/21 0800  ?BP: 122/78 131/76 130/83 (!) 149/78  ?Pulse: 78 81 88 89  ?Resp: 14 (!) 23 15 13   ?Temp:    98.4 ?F (36.9 ?C)  ?TempSrc:    Oral  ?SpO2: 97% 97% 95% 96%  ?Weight:      ? ?CBC:  ?Recent Labs  ?Lab 05/31/21 ?0025 05/31/21 ?07/31/21 06/01/21 ?0019  ?WBC 10.7*  --  10.1  ?NEUTROABS 8.6*  --   --   ?HGB 15.5 16.7 13.9  ?HCT 46.3 49.0 43.3  ?MCV 86.1  --  89.3  ?PLT 240  --  234  ? ? ?Basic Metabolic Panel:  ?Recent Labs  ?Lab 05/31/21 ?0025 05/31/21 ?07/31/21 06/01/21 ?0019  ?NA 140 142 138  ?K 3.1* 3.1* 3.8  ?CL 106 105 107  ?CO2 21*  --  19*  ?GLUCOSE 246* 250* 169*  ?BUN 13 15 15   ?CREATININE 0.69 0.50* 0.69  ?CALCIUM 9.2  --  8.8*  ? ? ?Lipid Panel:  ?Recent Labs  ?Lab 05/31/21 ?0646  ?CHOL 247*  ?TRIG 37  ?HDL 44  ?CHOLHDL 5.6  ?VLDL 7  ?LDLCALC 196*  ? ? ?HgbA1c:  ?Recent Labs  ?Lab 05/31/21 ?0646  ?HGBA1C 9.5*  ? ? ?Urine Drug Screen: No results for input(s): LABOPIA, COCAINSCRNUR, LABBENZ, AMPHETMU, THCU, LABBARB in the last 168 hours.  ?Alcohol Level No results for input(s): ETH in the last 168 hours. ? ?IMAGING past 24 hours ?MR BRAIN WO CONTRAST ? ?Result Date: 05/31/2021 ?CLINICAL DATA:  Initial evaluation for acute neuro deficit, stroke suspected EXAM: MRI HEAD WITHOUT CONTRAST TECHNIQUE: Multiplanar, multiecho pulse sequences of the brain and surrounding structures were obtained without intravenous contrast.  COMPARISON:  Comparison made with prior head CTs and CTA from earlier the same [facility]. FINDINGS: Brain: Cerebral volume within normal limits. No significant cerebral white matter disease. Confluent restricted diffusion involving the inferior left cerebellum consistent with an evolving acute left PICA distribution infarct (series 5, image 56). Area of infarction measures approximately 5.3 x 3.3 cm in size. Minimal patchy involvement of the left lateral margin of the medulla noted as well (series 5, image 55). No associated hemorrhage. Mild localized edema without significant regional mass effect at this time. Fourth ventricle and fourth ventricular outflow tract remain patent at this time. No other evidence for acute or subacute ischemia. Gray-white matter differentiation otherwise maintained. No encephalomalacia to suggest chronic cortical infarction or other insult. No acute or chronic intracranial blood products. No mass lesion or midline shift. No hydrocephalus or extra-axial fluid collection. Pituitary gland suprasellar region normal. Midline structures intact and normal. Vascular: Loss of normal flow void within the left V4 segment, consistent with previously identified occlusion. Major intracranial vascular flow voids otherwise maintained. Skull and upper cervical spine: Craniocervical junction within normal limits. Bone marrow signal intensity normal. No scalp soft tissue abnormality. Sinuses/Orbits: Conjugate right gaze noted. Globes orbital soft  tissues demonstrate no other acute finding. Paranasal sinuses are otherwise clear. No mastoid effusion. Inner ear structures grossly normal. Other: None. IMPRESSION: 1. Evolving acute left PICA distribution infarct involving the left cerebellum and adjacent left lateral medulla as above. No associated hemorrhage. Localized edema without significant regional mass effect. Fourth ventricle and fourth ventricular outflow tract remain patent at this time. No hydrocephalus.  2. Loss of normal flow void within the left V4 segment, consistent with previously identified occlusion. 3. Otherwise normal brain MRI. Electronically Signed   By: Rise Mu M.D.   On: 05/31/2021 23:21   ? ?PHYSICAL EXAM ?General:  Alert, well-developed, well-nourished middle-age Caucasian patient in no acute distress ?Respiratory:  Regular, unlabored respirations on room air ? ?NEURO:  ?Mental Status: AA&Ox3  ?Speech/Language: speech is hoarse with mild dysarthria.  Naming, repetition, fluency, and comprehension intact. ? ?Cranial Nerves:  ?II: PERRL. Visual fields full.  ?III, IV, VI: EOMI. Eyelids elevate symmetrically.  ?V: Sensation is intact to light touch and symmetrical to face.  ?VII: Smile is symmetrical.   ?VIII: hearing intact to voice. ?IX, X: Phonation is normal.  ?YO:VZCHYIFO shrug 5/5. ?XII: tongue is midline without fasciculations. ?Motor: 5/5 strength to all muscle groups tested.  ?Sensation- Intact to light touch bilaterally.  ?Coordination: FTN intact bilaterally ?Gait- deferred ? ? ?ASSESSMENT/PLAN ?[name] is a 51 y.o. male with history of T2DM presenting with sudden onset dizziness, imbalance, headache, left-sided weakness, nausea, vomiting and slurred speech.  He fell to the ground and was brought to the ED.  He was evaluated for acute stroke and given TNK given his disabling symptoms.  Imaging shows a Left vertebral occlusion and Left pica infarct. Echo pending. Plan for DAPT for 3 months with ASA and plavix and cerebellar angiogram today or tomorrow. NPO at midnight.  ? 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 ASPECTS 10.    ?CTA head & neck Diffuse narrowing of left V3 segment with occlusion of left vertebral artery, age indeterminate, atheromatous changes in carotid siphons ?MRI acute left PICA distribution infarct involving left cerebellum and adjacent left lateral medulla. ?2D Echo  left ventricular ejection fraction of 55 to 60%.  LDL 196 ?HgbA1c 9.5 ?VTE prophylaxis - SCDs ?   ?Diet  ? Diet Carb Modified Fluid consistency: Thin; Room service appropriate? Yes  ? ?No antithrombotic prior to admission, now on ASA 81mg  and Plavix 75mg  for 3 months  ?Therapy recommendations: Inpatient rehab ?disposition:  pending ? ?Hypertension ?Home meds:  none ?Stable ?Keep BP <180/105 ?Long-term BP goal normotensive ? ?Hyperlipidemia ?Home meds:  none ?LDL 196, goal < 70 ?Add atorvastatin 80 mg daily and zetia 10mg  ?Continue statin at discharge ? ?Diabetes type II Uncontrolled ?Home meds:  Jardiance 25 mg daily, pioglitazone 30 mg daily ?HgbA1c 9.5, goal < 7.0 ?CBGs ?Recent Labs  ?  05/31/21 ?2323 06/01/21 ?0345 06/01/21 ?0759  ?GLUCAP 228* 135* 139*  ? ?  ?SSI ? ?Other Stroke Risk Factors ?OSA with Cpap every night  ? ?Other Active Problems ?none ?I have personally obtained history,examined this patient, reviewed notes, independently viewed imaging studies, participated in medical decision making and plan of care.ROS completed by me personally and pertinent positives fully documented  I have made any additions or clarifications directly to the above note. Agree with note above.  Patient presented with sudden onset of dizziness and slurred speech, nausea vomiting, difficulty swallowing and weakness which appears to  be improving and MRI scan shows left PICA as well as adjacent lateral medullary infarct likely from terminal left vertebral artery occlusion.  Remains at risk for neurological worsening and recurrent stroke.  Recommend close neurological monitoring and diagnostic cerebral catheter angiogram to see if left vertebral artery occlusion is amenable to endovascular treatment with angioplasty and stenting.  Dual antiplatelet therapy aspirin and Plavix for 3 months followed by aspirin alone and aggressive risk factor modification.  Greater than 50% time during this 35-minute visit was spent on counseling  and coordination of care about his stroke and discussion about MRI and MRA findings evaluation and treatment plan and answering questions.  Discussed with Dr. Corliss Skainseveshwar ? ?Frank HeadyPramod Jaylenn Baiza, MD ?Medical Director

## 2021-06-01 NOTE — Progress Notes (Signed)
Inpatient Diabetes Program Recommendations ? ?AACE/ADA: New Consensus Statement on Inpatient Glycemic Control (2015) ? ?Target Ranges:  Prepandial:   less than 140 mg/dL ?     Peak postprandial:   less than 180 mg/dL (1-2 hours) ?     Critically ill patients:  140 - 180 mg/dL  ? ?Lab Results  ?Component Value Date  ? GLUCAP 164 (H) 06/01/2021  ? HGBA1C 9.5 (H) 05/31/2021  ? ? ?Review of Glycemic Control ? ?Diabetes history: DM 2 ?Outpatient Diabetes medications: Jardiance 25 mg Daily, Actos 30 mg Daily ?Current orders for Inpatient glycemic control:  ?Novolog 0-15 units Q4 hours ? ?Spoke with pt and wife at bedside regarding A1c of 9.5% and glucose control at home. Pt reports not having a glucose meter at home and his A1c has been at an 8%.  Discussed lifestyle modifications with pt and wife. Discussed glucose and A1c goals and importance of glucose control. ? ?D/c needs:  ?Glucometer at time of d/c ?Order # [id number] ? ?Thanks, ? ?Christena Deem RN, MSN, BC-ADM ?Inpatient Diabetes Coordinator ?Team Pager [phone] (8a-5p) ?

## 2021-06-01 NOTE — Consult Note (Signed)
? ?Chief Complaint: ?Dizziness, left sided weakness, Nausea, vomiting and slurred speech. Concern for left {ICA infarct. Request is for cerebral angiogram.  ? ?Referring Physician(s): ?Code Stroke ? ?Supervising Physician: Pedro Earls ? ?Patient Status: [facility] - In-pt ? ?History of Present Illness: ?Frank Cuevas is a 51 y.o. male  inpatient. Presented to the ED at [facility] on 4.2.22 with dizziness, left sided weakness, Nausea, vomiting and slurred speech. MR brain from 4.2.23 reads 1. Evolving acute left PICA distribution infarct involving the left cerebellum and adjacent left lateral medulla as above. No associated hemorrhage. Localized edema without significant regional mass effect. Fourth ventricle and fourth ventricular outflow tract remain patent at this time. No hydrocephalus. 2. Loss of normal flow void within the left V4 segment, consistent with previously identified occlusion.Team is requesting a cerebral angiogram for further evaluation.  ? ?Currently without any significant complaints. Patient alert and laying in bed, calm and comfortable. Girlfriend at bedside. Endorses intermittent dizziness. Denies any fevers, headache, chest pain, SOB, cough, abdominal pain, nausea, vomiting or bleeding. Return precautions and treatment recommendations and follow-up discussed with the patient  who is agreeable with the plan. ? ? ? ?Past Medical History:  ?Diagnosis Date  ? Diabetes mellitus without complication ([place])   ? ? ?Past Surgical History:  ?Procedure Laterality Date  ? NO PAST SURGERIES    ? ? ?Allergies: ?Onion ? ?Medications: ?Prior to Admission medications   ?Medication Sig Start Date End Date Taking? Authorizing Provider  ?JARDIANCE 25 MG TABS tablet Take 25 mg by mouth daily. 03/31/21  Yes [provider]  ?OVER THE COUNTER MEDICATION Take 2 tablets by mouth in the morning and at bedtime. Relief factor   Yes [provider]  ?pioglitazone (ACTOS) 30 MG tablet Take 30 mg by mouth  daily. 05/08/21  Yes [provider]  ?sildenafil (VIAGRA) 100 MG tablet Take 100 mg by mouth daily as needed for erectile dysfunction. 01/13/21  Yes [provider]  ?  ? ?No family history on file. ? ?Social History  ? ?Socioeconomic History  ? Marital status: Married  ?  Spouse name: Not on file  ? Number of children: Not on file  ? Years of education: Not on file  ? Highest education level: Not on file  ?Occupational History  ? Not on file  ?Tobacco Use  ? Smoking status: Never  ? Smokeless tobacco: Never  ?Substance and Sexual Activity  ? Alcohol use: Not on file  ? Drug use: Never  ? Sexual activity: Not on file  ?Other Topics Concern  ? Not on file  ?Social History Narrative  ? Not on file  ? ?Social Determinants of Health  ? ?Financial Resource Strain: Not on file  ?Food Insecurity: Not on file  ?Transportation Needs: Not on file  ?Physical Activity: Not on file  ?Stress: Not on file  ?Social Connections: Not on file  ? ? ?Review of Systems: A 12 point ROS discussed and pertinent positives are indicated in the HPI above.  All other systems are negative. ? ?Review of Systems  ?Constitutional:  Negative for fever.  ?HENT:  Negative for congestion.   ?Respiratory:  Negative for cough and shortness of breath.   ?Cardiovascular:  Negative for chest pain.  ?Gastrointestinal:  Negative for abdominal pain.  ?Neurological:  Positive for dizziness (intermittent). Negative for headaches.  ?Psychiatric/Behavioral:  Negative for behavioral problems and confusion.   ? ?Vital Signs: ?BP (!) 137/94 (BP Location: Left Arm)   Pulse 87  Temp 98.2 ?F (36.8 ?C) (Oral)   Resp 16   Wt 242 lb 8.1 oz (110 kg)   SpO2 98%  ? ?Physical Exam ?Vitals and nursing note reviewed.  ?Constitutional:   ?   Appearance: He is well-developed.  ?HENT:  ?   Head: Normocephalic.  ?Cardiovascular:  ?   Rate and Rhythm: Normal rate and regular rhythm.  ?   Heart sounds: Normal heart sounds.  ?Pulmonary:  ?   Effort: Pulmonary  effort is normal.  ?Musculoskeletal:     ?   General: Normal range of motion.  ?   Cervical back: Normal range of motion.  ?Skin: ?   General: Skin is dry.  ?Neurological:  ?   General: No focal deficit present.  ?   Mental Status: He is alert and oriented to person, place, and time.  ?Psychiatric:     ?   Mood and Affect: Mood normal.     ?   Behavior: Behavior normal.     ?   Thought Content: Thought content normal.     ?   Judgment: Judgment normal.  ? ? ?Imaging: ?CT HEAD WO CONTRAST (5MM) ? ?Result Date: 05/31/2021 ?CLINICAL DATA:  51 year old male with sudden severe headache. Code stroke presentation 0033 hours today. 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 EXAM: CT HEAD WITHOUT CONTRAST TECHNIQUE: Contiguous axial images were obtained from the base of the skull through the vertex without intravenous contrast. RADIATION DOSE REDUCTION: This exam was performed according to the departmental dose-optimization program which includes automated exposure control, adjustment of the mA and/or kV according to patient size and/or use of iterative reconstruction technique. COMPARISON:  Head CT and CTA head and neck earlier today since 0033 hours. FINDINGS: Brain: Normal cerebral volume. Subtle new hypodensity left cerebellum PICA territory (coronal image 59 now versus series 5, image 60 on the initial head CT). No associated hemorrhage or mass effect. Elsewhere gray-white matter differentiation appears stable. No other acute or evolving infarct identified. No midline shift, ventriculomegaly, mass effect, evidence of mass lesion, or intracranial hemorrhage identified. Vascular: Calcified atherosclerosis at the skull base. Mild if any residual intravascular contrast. Skull: No acute osseous abnormality identified. Sinuses/Orbits: Visualized paranasal sinuses and mastoids are stable and well aerated. Other: Visualized orbits and scalp soft tissues are within normal limits. IMPRESSION: 1. Head CT appearance now highly  suspicious for evolving Left PICA infarct. No hemorrhage or mass effect. 2. Elsewhere stable and negative CT appearance of the brain. 3. These results were communicated to Dr. Lorrin Goodell at 5:48 am on 05/31/2021 by text page via the [facility] messaging system. Electronically Signed   By: Genevie Ann M.D.   On: 05/31/2021 05:55  ? ?MR BRAIN WO CONTRAST ? ?Result Date: 05/31/2021 ?CLINICAL DATA:  Initial evaluation for acute neuro deficit, stroke suspected EXAM: MRI HEAD WITHOUT CONTRAST TECHNIQUE: Multiplanar, multiecho pulse sequences of the brain and surrounding structures were obtained without intravenous contrast. COMPARISON:  Comparison made with prior head CTs and CTA from earlier the same [place]. FINDINGS: Brain: Cerebral volume within normal limits. No significant cerebral white matter disease. Confluent restricted diffusion involving the inferior left cerebellum consistent with an evolving acute left PICA distribution infarct (series 5, image 56). Area of infarction measures approximately 5.3 x 3.3 cm in size. Minimal patchy involvement of the left lateral margin of the medulla noted as well (series 5, image 55). No associated hemorrhage. Mild localized edema without significant regional mass effect at this time. Fourth ventricle and fourth  ventricular outflow tract remain patent at this time. No other evidence for acute or subacute ischemia. Gray-white matter differentiation otherwise maintained. No encephalomalacia to suggest chronic cortical infarction or other insult. No acute or chronic intracranial blood products. No mass lesion or midline shift. No hydrocephalus or extra-axial fluid collection. Pituitary gland suprasellar region normal. Midline structures intact and normal. Vascular: Loss of normal flow void within the left V4 segment, consistent with previously identified occlusion. Major intracranial vascular flow voids otherwise maintained. Skull and upper cervical spine: Craniocervical junction within normal  limits. Bone marrow signal intensity normal. No scalp soft tissue abnormality. Sinuses/Orbits: Conjugate right gaze noted. Globes orbital soft tissues demonstrate no other acute finding. Paranasal sinuses are oth

## 2021-06-01 NOTE — Evaluation (Signed)
Speech Language Pathology Evaluation ?Patient Details ?Name: Hilmer Kasson ?MRN: [id number] ?DOB: [birth year] ?Today's Date: 06/01/2021 ?Time: 8413-2440 ?SLP Time Calculation (min) (ACUTE ONLY): 22 min ? ?Problem List:  ?Patient Active Problem List  ? Diagnosis Date Noted  ? Stroke determined by clinical assessment (HCC) 05/31/2021  ? ?Past Medical History:  ?Past Medical History:  ?Diagnosis Date  ? Diabetes mellitus without complication (HCC)   ? ?Past Surgical History:  ?Past Surgical History:  ?Procedure Laterality Date  ? NO PAST SURGERIES    ? ?HPI:  ?Denario Hari is a 51 y.o. male with PMH significant for diabetes type 2 non-insulin-dependent who presents with sudden onset dizziness, left-sided weakness, nausea, vomiting, slurred speech. 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  ? ?Assessment / Plan / Recommendation ?Clinical Impression ? Pt presents with clear fluent speech without dysarthria; normal expressive and receptive language with good comprehension of complex information and preserved ability to express novel information. Cognition is intact.  No deficits identified. No SLP f/u is needed - our service will sign off. ?   ?SLP Assessment ? SLP Recommendation/Assessment: Patient does not need any further Speech Lanaguage Pathology Services ?SLP Visit Diagnosis: Cognitive communication deficit (R41.841)  ?  ?Recommendations for follow up therapy are one component of a multi-disciplinary discharge planning process, led by the attending physician.  Recommendations may be updated based on patient status, additional functional criteria and insurance authorization. ?   ?Follow Up Recommendations ? No SLP follow up  ?  ?    ?    ?    ?  ?  ?   ?SLP Evaluation ?Cognition ? Overall Cognitive Status: Within Functional Limits for tasks assessed ?Arousal/Alertness: Awake/alert ?Orientation Level: Oriented X4 ?Memory: Appears  intact ?Safety/Judgment: Appears intact  ?  ?   ?Comprehension ? Auditory Comprehension ?Overall Auditory Comprehension: Appears within functional limits for tasks assessed ?Yes/No Questions: Within Functional Limits ?Commands: Within Functional Limits ?Conversation: Complex ?Visual Recognition/Discrimination ?Discrimination: Within Function Limits ?Reading Comprehension ?Reading Status: Within funtional limits  ?  ?Expression Expression ?Primary Mode of Expression: Verbal ?Verbal Expression ?Overall Verbal Expression: Appears within functional limits for tasks assessed ?Written Expression ?Dominant Hand: Right ?Written Expression: Not tested   ?Oral / Motor ? Oral Motor/Sensory Function ?Overall Oral Motor/Sensory Function: Within functional limits ?Motor Speech ?Overall Motor Speech: Appears within functional limits for tasks assessed   ?        ? ?Blenda Mounts Laurice ?06/01/2021, 12:21 PM ?Bobak Oguinn L. Kellyjo Edgren, MA CCC/SLP ?Acute Rehabilitation Services ?Office number [phone] ?Pager [phone] ? ?

## 2021-06-01 NOTE — Evaluation (Signed)
Occupational Therapy Evaluation ?Patient Details ?Name: Frank Cuevas ?MRN: [id number] ?DOB: 21-Dec-1970 ?Today's Date: 06/01/2021 ? ? ?History of Present Illness Frank Cuevas is a 51 y.o. male with who presents with sudden onset dizziness, left-sided weakness, nausea, vomiting, slurred speech.  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 TNK administered. PMH significant for diabetes type 2 non-insulin-dependent  ? ?Clinical Impression ?  ?Frank Cuevas was evaluated s/p the above CVA, he is completely indep at baseline including working full time. He lives in a 1 level home with 1 STE with family and significant other near by to provide 24/7 assist if needed at d/c. Upon evaluation pt denies all symptoms with the exception of dizziness after movement. Overall he was min G for bed mobility, transfers, ambulation and ADLs. Pt limited by mild dizziness with position changes; educated on gaze stabilization strategy and pt demonstrated great understanding. Pt will benefit from OT acutely. Recommend d/c to CIR to progress towards pt's very indep baseline prior to going home; however pt may progress quickly acutely and need updated d/c to home with OP OT.  ?   ? ?Recommendations for follow up therapy are one component of a multi-disciplinary discharge planning process, led by the attending physician.  Recommendations may be updated based on patient status, additional functional criteria and insurance authorization.  ? ?Follow Up Recommendations ? Acute inpatient rehab (3hours/[place]) (Depending on acute LOS, pt may progress to home with OP OT)  ?  ?Assistance Recommended at Discharge Frequent or constant Supervision/Assistance  ?Patient can return home with the following A little help with walking and/or transfers;Assist for transportation;A little help with bathing/dressing/bathroom ? ?  ?Functional Status Assessment ? Patient has had a recent decline in their  functional status and demonstrates the ability to make significant improvements in function in a reasonable and predictable amount of time.  ?Equipment Recommendations ? Tub/shower seat  ?  ?Recommendations for Other Services Rehab consult ? ? ?  ?Precautions / Restrictions Precautions ?Precautions: Fall ?Restrictions ?Weight Bearing Restrictions: No  ? ?  ? ?Mobility Bed Mobility ?Overal bed mobility: Needs Assistance ?Bed Mobility: Rolling, Sidelying to Sit ?Rolling: Supervision ?Sidelying to sit: Supervision ?  ?  ?  ?General bed mobility comments: for safety ?  ? ?Transfers ?Overall transfer level: Needs assistance ?Equipment used: None ?Transfers: Sit to/from Stand ?Sit to Stand: Supervision ?  ?  ?  ?  ?  ?General transfer comment: cues for safety ?  ? ?  ?Balance Overall balance assessment: Needs assistance ?Sitting-balance support: Feet supported ?Sitting balance-Leahy Scale: Good ?  ?  ?Standing balance support: No upper extremity supported, During functional activity ?Standing balance-Leahy Scale: Fair ?  ?  ?  ?  ?  ?  ?   ? ?ADL either performed or assessed with clinical judgement  ? ?ADL Overall ADL's : Needs assistance/impaired ?Eating/Feeding: Independent;Sitting ?  ?Grooming: Standing;Min guard ?  ?Upper Body Bathing: Set up;Sitting ?  ?Lower Body Bathing: Min guard;Sit to/from stand ?  ?Upper Body Dressing : Set up;Sitting ?  ?Lower Body Dressing: Min guard;Sit to/from stand ?  ?Toilet Transfer: Min guard;Ambulation ?Toilet Transfer Details (indicate cue type and reason): with cues for gaze stabilization ?Toileting- Clothing Manipulation and Hygiene: Supervision/safety;Sitting/lateral lean ?  ?  ?  ?Functional mobility during ADLs: Min guard;Cueing for safety ?General ADL Comments: limited by dizziness with mobility, worked on gaze stabilization for safety OOB ADLs and mobility  ? ? ? ?  Vision Baseline Vision/History: 1 Wears glasses ?Patient Visual Report: Other (comment) (dizzy with  movement) ?Additional Comments: needs to be further assessed; overall WFL for basic tasks this session. used gaze stabilization for mobility  ?   ?   ?   ? ?Pertinent Vitals/Pain Pain Assessment ?Pain Assessment: No/denies pain  ? ? ? ?Hand Dominance Right ?  ?Extremity/Trunk Assessment Upper Extremity Assessment ?Upper Extremity Assessment: Overall WFL for tasks assessed ?  ?Lower Extremity Assessment ?Lower Extremity Assessment: Defer to PT evaluation ?  ?Cervical / Trunk Assessment ?Cervical / Trunk Assessment: Normal ?  ?Communication Communication ?Communication: No difficulties ?  ?Cognition Arousal/Alertness: Awake/alert ?Behavior During Therapy: [facility] for tasks assessed/performed ?Overall Cognitive Status: Within Functional Limits for tasks assessed ?  ?  ?  ?  ?  ?  ?  ?  ?  ?  ?General Comments  VSS on RA, so present and supportive ? ?  ? ?Home Living Family/patient expects to be discharged to:: Private residence ?Living Arrangements: Children ?Available Help at Discharge: Available 24 hours/[place] ?Type of Home: House ?Home Access: Stairs to enter ?Entrance Stairs-Number of Steps: 1 ?Entrance Stairs-Rails: Right ?Home Layout: One level ?  ?  ?Bathroom Shower/Tub: Walk-in shower ?  ?Bathroom Toilet: Handicapped height ?  ?  ?Home Equipment: None ?  ?Additional Comments: s.o. and family can work out 24/7 assist ?  ? ?  ?Prior Functioning/Environment Prior Level of Function : Independent/Modified Independent ?  ?  ?  ?  ?  ?  ?Mobility Comments: indep, no AD ?ADLs Comments: works 2 jobs, indep in all IADLs ?  ? ?  ?  ?OT Problem List: Decreased strength;Decreased range of motion;Impaired balance (sitting and/or standing) ?  ?   ?OT Treatment/Interventions: Self-care/ADL training;Therapeutic exercise;Balance training;Patient/family education;Therapeutic activities;DME and/or AE instruction  ?  ?OT Goals(Current goals can be found in the care plan section) Acute Rehab OT Goals ?Patient Stated Goal: home ?OT Goal  Formulation: With patient ?Time For Goal Achievement: 06/15/21 ?Potential to Achieve Goals: Good ?ADL Goals ?Additional ADL Goal #1: Pt will indep complete all BADLs  ?OT Frequency: Min 2X/week ?  ? ?   ?AM-PAC OT "6 Clicks" Daily Activity     ?Outcome Measure Help from another person eating meals?: None ?Help from another person taking care of personal grooming?: A Little ?Help from another person toileting, which includes using toliet, bedpan, or urinal?: A Little ?Help from another person bathing (including washing, rinsing, drying)?: A Little ?Help from another person to put on and taking off regular upper body clothing?: None ?Help from another person to put on and taking off regular lower body clothing?: A Little ?6 Click Score: 20 ?  ?End of Session Nurse Communication: Mobility status (Pt's L IV out of arm) ? ?Activity Tolerance: Patient tolerated treatment well ?Patient left:  (with WC with RN to transfer units) ? ?OT Visit Diagnosis: Unsteadiness on feet (R26.81);History of falling (Z91.81);Muscle weakness (generalized) (M62.81);Pain  ?              ?Time: 2549-8264 ?OT Time Calculation (min): 19 min ?Charges:  OT General Charges ?$OT Visit: 1 Visit ?OT Evaluation ?$OT Eval Moderate Complexity: 1 Mod ? ? ? ?Thao Bauza A Johnesha Acheampong ?06/01/2021, 1:21 PM ?

## 2021-06-02 ENCOUNTER — Inpatient Hospital Stay (HOSPITAL_COMMUNITY): Payer: BC Managed Care – PPO

## 2021-06-02 DIAGNOSIS — I639 Cerebral infarction, unspecified: Secondary | ICD-10-CM | POA: Diagnosis not present

## 2021-06-02 HISTORY — PX: IR ANGIO INTRA EXTRACRAN SEL INTERNAL CAROTID BILAT MOD SED: IMG5363

## 2021-06-02 HISTORY — PX: IR ANGIO VERTEBRAL SEL VERTEBRAL BILAT MOD SED: IMG5369

## 2021-06-02 HISTORY — PX: IR US GUIDE VASC ACCESS RIGHT: IMG2390

## 2021-06-02 LAB — BASIC METABOLIC PANEL
Anion gap: 11 (ref 5–15)
BUN: 17 mg/dL (ref 6–20)
CO2: 20 mmol/L — ABNORMAL LOW (ref 22–32)
Calcium: 8.4 mg/dL — ABNORMAL LOW (ref 8.9–10.3)
Chloride: 103 mmol/L (ref 98–111)
Creatinine, Ser: 0.67 mg/dL (ref 0.61–1.24)
GFR, Estimated: >60 mL/min (ref >60)
Glucose, Bld: 148 mg/dL — ABNORMAL HIGH (ref 70–99)
Potassium: 3.3 mmol/L — ABNORMAL LOW (ref 3.5–5.1)
Sodium: 134 mmol/L — ABNORMAL LOW (ref 135–145)

## 2021-06-02 LAB — PROTIME-INR
INR: 1 (ref 0.8–1.2)
Prothrombin Time: 13.6 seconds (ref 11.4–15.2)

## 2021-06-02 LAB — CBC
HCT: 42.2 % (ref 39.0–52.0)
Hemoglobin: 14.3 g/dL (ref 13.0–17.0)
MCH: 29.4 pg (ref 26.0–34.0)
MCHC: 33.9 g/dL (ref 30.0–36.0)
MCV: 86.7 fL (ref 80.0–100.0)
Platelets: 224 10*3/uL (ref 150–400)
RBC: 4.87 MIL/uL (ref 4.22–5.81)
RDW: 13.2 % (ref 11.5–15.5)
WBC: 8.6 10*3/uL (ref 4.0–10.5)
nRBC: 0 % (ref 0.0–0.2)

## 2021-06-02 LAB — GLUCOSE, CAPILLARY
Glucose-Capillary: 182 mg/dL — ABNORMAL HIGH (ref 70–99)
Glucose-Capillary: 126 mg/dL — ABNORMAL HIGH (ref 70–99)

## 2021-06-02 LAB — APTT: aPTT: 27 seconds (ref 24–36)

## 2021-06-02 IMAGING — XA IR CAROTID INTERNAL HEAD/NECK BILAT  (MS)
10 of 13 series · 11 of 24 positions shown · IV contrast (IODINE)
Comparison: CT/CT angiogram of the head and neck [DATE].

INDICATION: ARMOND is a 50-year-old male presented to the ED on [REDACTED] with
dizziness, left sided weakness, nausea, vomiting and slurred speech.
CT angiogram showed occlusion of the left vertebral artery at the V3
and V4 segments. MRI of the brain performed later that day showed a
left PICA territory infarct. He comes to our service today for a
diagnostic cerebral angiogram to evaluate cervical and cerebral
vasculature.

EXAM:
ULTRASOUND-GUIDED VASCULAR [REDACTED] CEREBRAL ANGIOGRAM
TECHNIQUE: Informed written consent was obtained from the patient after a
thorough discussion of the procedural risks, benefits and
alternatives. All questions were addressed. Maximal Sterile Barrier
Technique was utilized including caps, mask, sterile gowns, sterile
gloves, sterile drape, hand hygiene and skin antiseptic. A timeout
was performed prior to the initiation of the procedure.

[Series 1: cerebral care 2 · 2 acquisitions, 1 frame shown (1 of 5)]
[im 1/2]
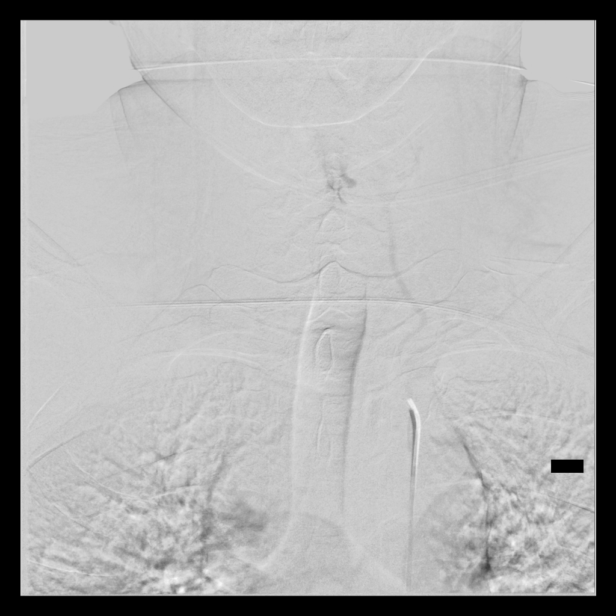

[Series 1: ir (id) (id) · 1 of 1 slices shown]
[im 1/1]
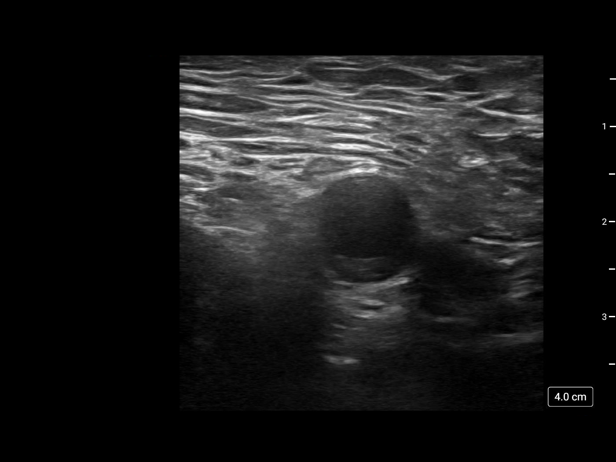

[Series 3: cerebral 2 · 2 acquisitions, 1 frame shown (1 of 2)]
[im 1/2]
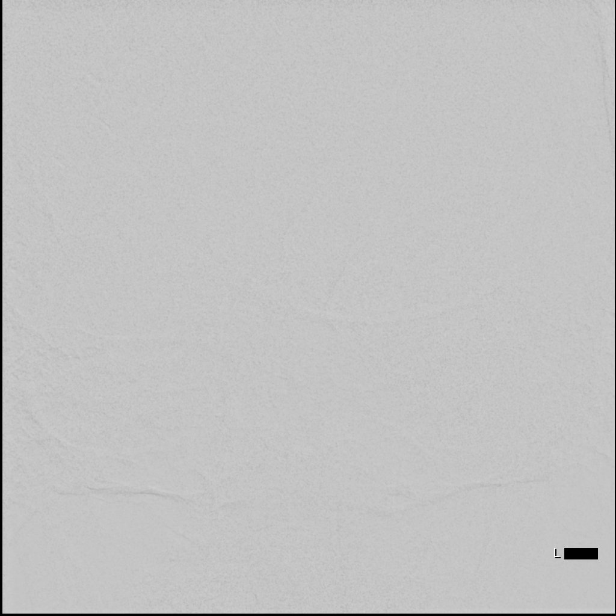

[Series 4: fl neuro n · 1 of 34 frames shown]
[frame 18/34]
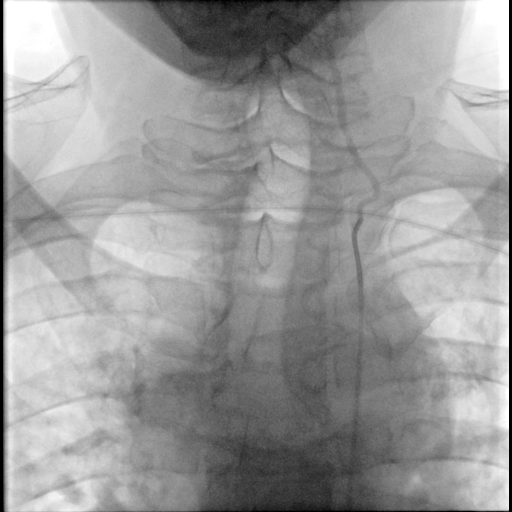

[Series 5: cerebral 2 · 2 acquisitions, 1 frame shown (2 of 2)]
[im 1/2]
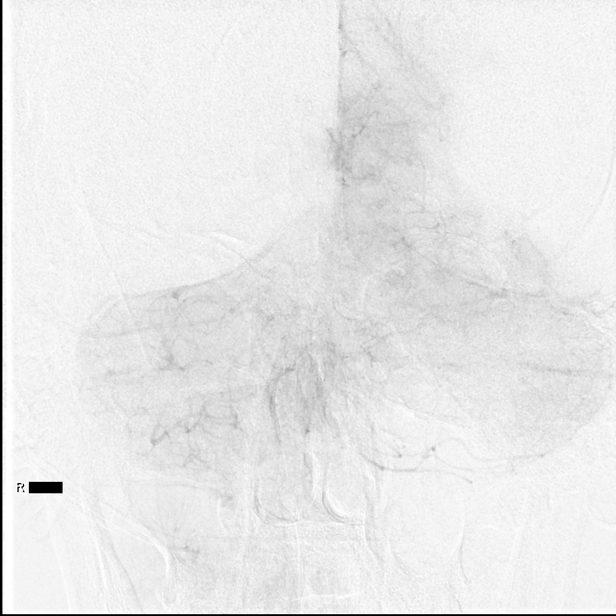

[Series 7: cerebral care 2 · 2 acquisitions, 1 frame shown (2 of 5)]
[im 1/2]
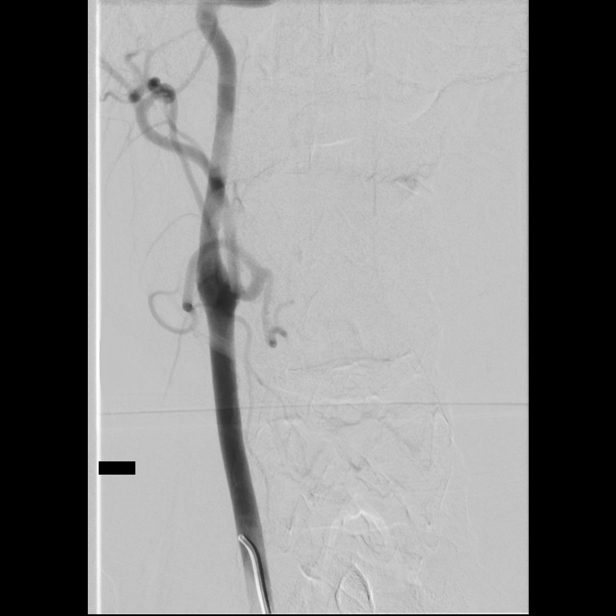

[Series 8: cerebral care 2 · 2 acquisitions, 1 frame shown (3 of 5)]
[im 1/2]
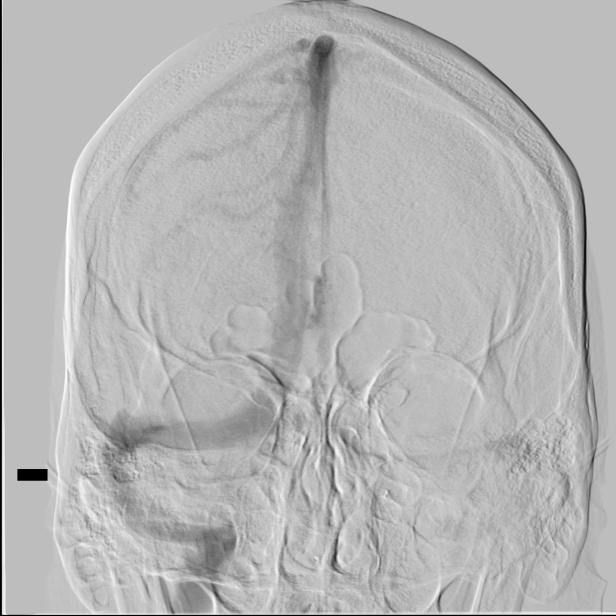

[Series 10: cerebral care 2 · 2 acquisitions, 1 frame shown (4 of 5)]
[im 1/2]
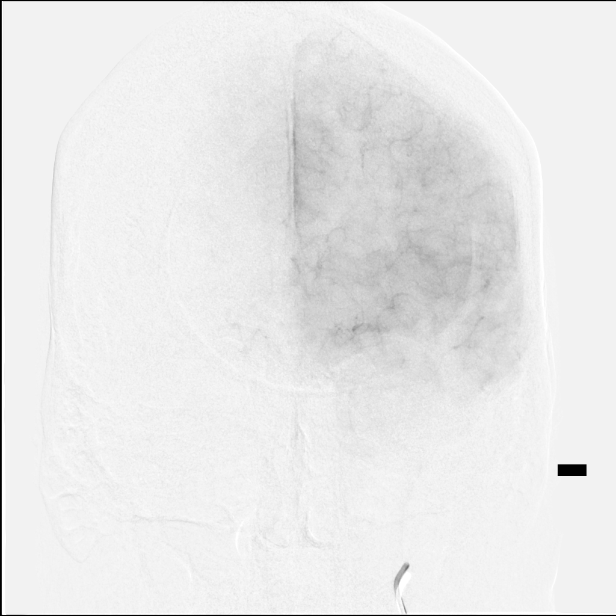

[Series 11: cerebral care 2 · 1 of 9 frames shown (5 of 5)]
[frame 7/9]
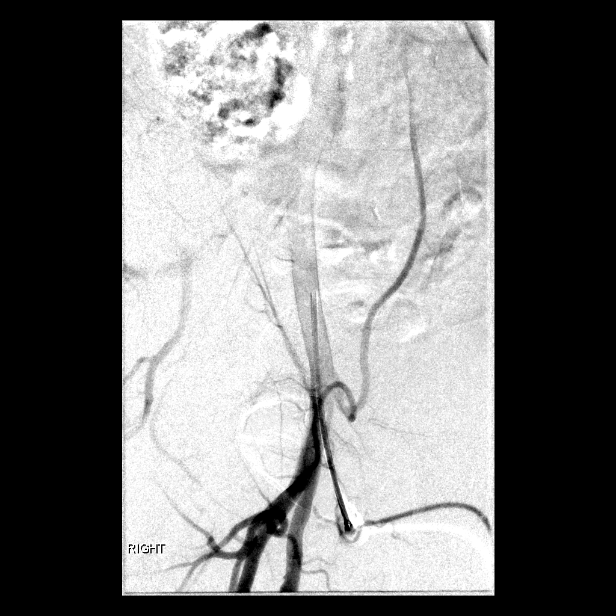

[Series 300: ir angio intra extracran sel com carotid · 2 of 19 slices shown]
[im 6/19]
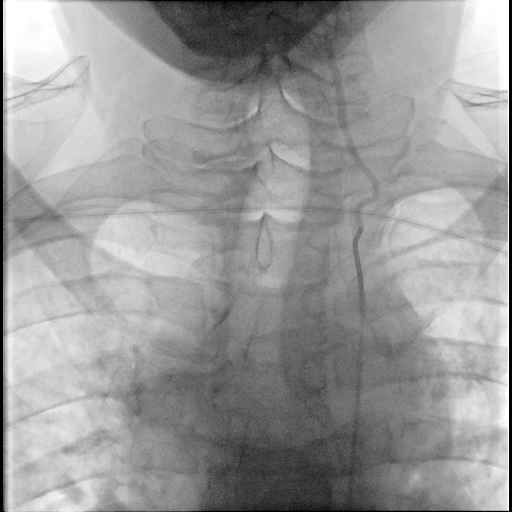
[im 15/19]
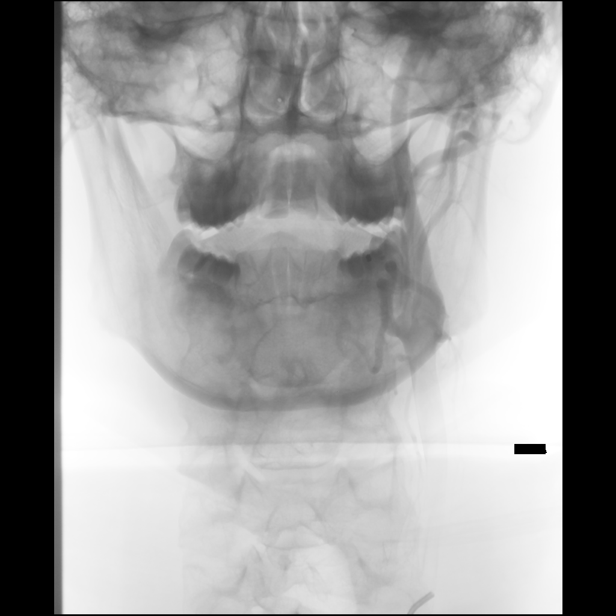

[11 of 24 positions shown; findings below may reference images not displayed]

MEDICATIONS:
No antibiotics as utilized.

ANESTHESIA/SEDATION:
Moderate (conscious) sedation was employed during this procedure. A
total of Versed 1 mg and Fentanyl 25 mcg was administered
intravenously by the radiology nurse.

Total intra-service moderate Sedation Time: 47 minutes. The
patient's level of consciousness and vital signs were monitored
continuously by radiology nursing throughout the procedure under my
direct supervision.

CONTRAST:  50 mL of Omnipaque 300 milligram/mL

FLUOROSCOPY:
Radiation Exposure Index (as provided by the fluoroscopic device):
698 mGy Kerma

COMPLICATIONS:
None immediate.
The right groin was prepped and draped in the usual sterile fashion.
Using a micropuncture kit and the modified Seldinger technique,
access was gained to the right common femoral artery and a 5 French
sheath was placed. Real-time ultrasound guidance was utilized for
vascular access including the acquisition of a permanent ultrasound
image documenting patency of the accessed vessel.

Under fluoroscopy, a 5 ARMOND 2 catheter was navigated
over a 0.035" Terumo Glidewire into the aortic arch. The catheter
was placed into the left subclavian artery. Frontal and lateral
angiograms of the neck were obtained. Using road map guidance, the
catheter was advanced into the right vertebral artery. Frontal and
lateral angiograms of the neck were obtained followed by magnified
frontal and lateral views of the head.

The catheter was then placed into the right vertebral artery.
Frontal, lateral, water's and magnified lateral views of the head
were obtained.

Next, the catheter was placed into the right common carotid artery.
Frontal and lateral angiograms of the neck were obtained. Using
biplane roadmap guidance, the catheter was placed into the right
internal carotid artery. Frontal and lateral angiograms of the head
were obtained.

Then, the catheter was placed into the left common carotid artery.
Frontal and lateral angiograms of the neck were obtained. Using
biplane roadmap guidance, the catheter was advanced into the left
internal carotid artery. Frontal and lateral angiograms of the head
were obtained.

The catheter was subsequently withdrawn.

Right common femoral artery angiogram was obtained in right anterior
oblique view. The puncture is at the level of the common femoral
artery. The artery has normal caliber, adequate for closure device.
A 5 French Exoseal was utilized. However, the plug did not deploy.
The device was retrieved in manual pressure was held for 15 minutes.
Adequate hemostasis was achieved.
FINDINGS: Left subclavian angiograms: The left subclavian artery and
visualized branches of the thyrocervical trunk are unremarkable.
Normal origin of the left vertebral artery with small bowel patent
caliber along the cervical segment.

Left vertebral artery angiograms: Abrupt tapering of the left
vertebral artery at the V3-V4 junction with associated flow limiting
filling defect in the proximal V4 segment. There is decreased and
slow flow to the left PICA. Minimal contrast opacification of the
basilar artery seen neck.

Right vertebral artery angiograms: Mild (less than 5% stenosis of
the right vertebral artery at the vertebrobasilar junction. There is
normal contrast opacification of the right PICA, basilar artery,
superior cerebellar arteries and left posterior cerebral artery.
Flash filling of the hypoplastic right P1/PCA segment. No aneurysms
or abnormally high-flow, early draining veins are seen. No regions
of abnormal hypervascularity are noted. The visualized dural sinuses
are patent.

Right CCA angiograms: Cervical angiograms show normal course and
caliber of the visualized right common carotid and internal carotid
arteries. There are no significant stenoses.

Right ICA angiograms: There is brisk vascular contrast filling of
the right ACA and MCA vascular trees. Brisk contrast opacification
of the left ACA vascular tree and right PCA vascular tree are seen
via anterior communicating artery and posterior communicating
artery, respectively. Luminal caliber is smooth and tapering. No
aneurysms or abnormally high-flow, early draining veins are seen. No
regions of abnormal hypervascularity are noted. The visualized dural
sinuses are patent.

Left CCA angiograms: Cervical angiograms show mild atherosclerotic
changes of the right common carotid artery without significant
stenosis. The left carotid bifurcation is maintained.

Left ICA angiograms: There is brisk vascular contrast filling of the
left ACA and MCA vascular trees. Brisk contrast opacification of the
right ACA vascular tree seen via anterior communicating artery.
Flash filling of the left PCA vascular tree with rapid washout.
Luminal caliber is smooth and tapering. No aneurysms or abnormally
high-flow, early draining veins are seen. No regions of abnormal
hypervascularity are noted. The visualized dural sinuses are patent.

Right common femoral artery ultrasound and angiograms: The access is
at the level of the mid right common femoral artery. The femoral
artery has normal caliber with minor atherosclerotic changes seen on
ultrasound.

PROCEDURE:
No intervention.
IMPRESSION: 1. Abrupt tapering of the left vertebral artery at the V3-V4
junction with flow limiting filling defect along the V4 segment.
Findings are most consistent with dissection and associated
intraluminal thrombus.
2. Mild (less than 50%) stenosis of the right vertebral artery at
the vertebrobasilar junction.
3. Normal contrast opacification of the basilar artery.

PLAN:
Findings communicated to ARMOND. ARMOND immediately at the end of the
procedure. No endovascular intervention planned at this moment.

## 2021-06-02 MED ORDER — ONDANSETRON 4 MG PO TBDP: 4.0000 mg | ORAL_TABLET | Freq: Three times a day (TID) | ORAL | 0 refills | Status: DC | PRN

## 2021-06-02 MED ORDER — ATORVASTATIN CALCIUM 80 MG PO TABS: 80.0000 mg | ORAL_TABLET | Freq: Every day | ORAL | 1 refills | Status: AC

## 2021-06-02 MED ORDER — MIDAZOLAM HCL 2 MG/2ML IJ SOLN
INTRAMUSCULAR | Status: AC | PRN
  Administered 2021-06-02: 1 mg via INTRAVENOUS

## 2021-06-02 MED ORDER — LIDOCAINE HCL 1 % IJ SOLN
INTRAMUSCULAR | Status: AC
  Filled 2021-06-02: qty 20

## 2021-06-02 MED ORDER — MIDAZOLAM HCL 2 MG/2ML IJ SOLN
INTRAMUSCULAR | Status: AC
  Filled 2021-06-02: qty 2

## 2021-06-02 MED ORDER — EZETIMIBE 10 MG PO TABS: 10.0000 mg | ORAL_TABLET | Freq: Every day | ORAL | 1 refills | Status: DC

## 2021-06-02 MED ORDER — FENTANYL CITRATE (PF) 100 MCG/2ML IJ SOLN
INTRAMUSCULAR | Status: AC | PRN
  Administered 2021-06-02: 25 ug via INTRAVENOUS

## 2021-06-02 MED ORDER — IOHEXOL 300 MG/ML  SOLN
100.0000 mL | Freq: Once | INTRAMUSCULAR | Status: AC | PRN
  Administered 2021-06-02: 50 mL via INTRA_ARTERIAL

## 2021-06-02 MED ORDER — CLOPIDOGREL BISULFATE 75 MG PO TABS: 75.0000 mg | ORAL_TABLET | Freq: Every day | ORAL | 1 refills | Status: DC

## 2021-06-02 MED ORDER — ONDANSETRON 4 MG PO TBDP
4.0000 mg | ORAL_TABLET | Freq: Three times a day (TID) | ORAL | Status: DC | PRN
  Administered 2021-06-02: 4 mg via ORAL
  Filled 2021-06-02: qty 1

## 2021-06-02 MED ORDER — ASPIRIN 81 MG PO TBEC
81.0000 mg | DELAYED_RELEASE_TABLET | Freq: Every day | ORAL | 11 refills | Status: AC
Start: 2021-06-03 — End: ?

## 2021-06-02 MED ORDER — ONDANSETRON HCL 4 MG PO TABS: 4.0000 mg | ORAL_TABLET | Freq: Three times a day (TID) | ORAL | 0 refills | Status: DC | PRN

## 2021-06-02 MED ORDER — FENTANYL CITRATE (PF) 100 MCG/2ML IJ SOLN
INTRAMUSCULAR | Status: AC
  Filled 2021-06-02: qty 2

## 2021-06-02 MED ORDER — LABETALOL HCL 5 MG/ML IV SOLN
INTRAVENOUS | Status: AC
  Filled 2021-06-02: qty 4

## 2021-06-02 MED ORDER — LABETALOL HCL 5 MG/ML IV SOLN
INTRAVENOUS | Status: AC | PRN
Start: 2021-06-02 — End: 2021-06-02
  Administered 2021-06-02: 5 mg via INTRAVENOUS

## 2021-06-02 NOTE — Discharge Summary (Addendum)
Stroke Discharge Summary  ?Patient ID: Ghazi Bittinger    l ?  MRN: [id number]    ?  DOB: Feb 02, 1971 ? ?Date of Admission: 05/31/2021 ?Date of Discharge: 06/02/2021 ? ?Attending Physician:  Delia Heady MD ?Consultant(s):     Interventional Radiology   ?Patient's PCP:  Medicine, [facility] ? 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  Stroke determined by clinical assessment (HCC) ?Dysarthria ?Facial palsy ?Intracrnial vertebral artery stenosis. ? ? ?Allergies as of 06/02/2021   ? ?   Reactions  ? Onion Anaphylaxis  ? ?  ? ?  ?Medication List  ?  ? ?TAKE these medications   ? ?aspirin 81 MG EC tablet ?Take 1 tablet (81 mg total) by mouth daily. Swallow whole. ?Start taking on: June 03, 2021 ?  ?atorvastatin 80 MG tablet ?Commonly known as: LIPITOR ?Take 1 tablet (80 mg total) by mouth daily. ?Start taking on: June 03, 2021 ?  ?clopidogrel 75 MG tablet ?Commonly known as: PLAVIX ?Take 1 tablet (75 mg total) by mouth daily. ?Start taking on: June 03, 2021 ?  ?ezetimibe 10 MG tablet ?Commonly known as: ZETIA ?Take 1 tablet (10 mg total) by mouth daily. ?Start taking on: June 03, 2021 ?  ?Jardiance 25 MG Tabs tablet ?Generic drug: empagliflozin ?Take 25 mg by mouth daily. ?  ?ondansetron 4 MG disintegrating tablet ?Commonly known as: ZOFRAN-ODT ?Take 1 tablet (4 mg total) by mouth every 8 (eight) hours as needed for nausea or vomiting. ?  ?ondansetron 4 MG tablet ?Commonly known as: Zofran ?Take 1 tablet (4 mg total) by mouth every 8 (eight) hours as needed for nausea or vomiting. ?  ?OVER THE COUNTER MEDICATION ?Take 2 tablets by mouth in the morning and at bedtime. Relief factor ?  ?pioglitazone 30 MG tablet ?Commonly known as: ACTOS ?Take 30 mg by mouth daily. ?  ?sildenafil 100 MG tablet ?Commonly known as: VIAGRA ?Take 100 mg by mouth daily as needed for erectile dysfunction. ?  ? ?  ? ? ?LABORATORY STUDIES ?CBC ?   ?Component  Value Date/Time  ? WBC 8.6 06/01/2021 2356  ? RBC 4.87 06/01/2021 2356  ? HGB 14.3 06/01/2021 2356  ? HCT 42.2 06/01/2021 2356  ? PLT 224 06/01/2021 2356  ? MCV 86.7 06/01/2021 2356  ? MCH 29.4 06/01/2021 2356  ? MCHC 33.9 06/01/2021 2356  ? RDW 13.2 06/01/2021 2356  ? LYMPHSABS 1.3 05/31/2021 0025  ? MONOABS 0.6 05/31/2021 0025  ? EOSABS 0.0 05/31/2021 0025  ? BASOSABS 0.1 05/31/2021 0025  ? ?CMP ?   ?Component Value Date/Time  ? NA 134 (L) 06/01/2021 2356  ? K 3.3 (L) 06/01/2021 2356  ? CL 103 06/01/2021 2356  ? CO2 20 (L) 06/01/2021 2356  ? GLUCOSE 148 (H) 06/01/2021 2356  ? BUN 17 06/01/2021 2356  ? CREATININE 0.67 06/01/2021 2356  ? CALCIUM 8.4 (L) 06/01/2021 2356  ? PROT 7.6 05/31/2021 0025  ? ALBUMIN 4.5 05/31/2021 0025  ? AST 17 05/31/2021 0025  ? ALT 23 05/31/2021 0025  ? ALKPHOS 88 05/31/2021 0025  ? BILITOT 1.1 05/31/2021 0025  ? GFRNONAA >60 06/01/2021 2356  ? ?COAGS ?Lab Results  ?Component Value Date  ? INR 1.0 06/01/2021  ? INR 1.0 05/31/2021  ? ?Lipid Panel ?   ?Component Value Date/Time  ? CHOL 247 (H) 05/31/2021 0646  ? TRIG 37 05/31/2021 0646  ? HDL 44 05/31/2021 0646  ? CHOLHDL  5.6 05/31/2021 0646  ? VLDL 7 05/31/2021 0646  ? LDLCALC 196 (H) 05/31/2021 16100646  ? ?HgbA1C  ?Lab Results  ?Component Value Date  ? HGBA1C 9.5 (H) 05/31/2021  ? ?Urinalysis ?No results found for: COLORURINE, APPEARANCEUR, LABSPEC, PHURINE, GLUCOSEU, HGBUR, BILIRUBINUR, KETONESUR, PROTEINUR, UROBILINOGEN, NITRITE, LEUKOCYTESUR ?Urine Drug Screen No results found for: LABOPIA, COCAINSCRNUR, LABBENZ, AMPHETMU, THCU, LABBARB  ?Alcohol Level ?No results found for: ETH ? ? ?SIGNIFICANT DIAGNOSTIC STUDIES ?CT HEAD WO CONTRAST (5MM) ? ?Result Date: 05/31/2021 ?CLINICAL DATA:  51 year old male with sudden severe headache. Code stroke presentation 0033 hours today. 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 EXAM: CT HEAD WITHOUT CONTRAST TECHNIQUE: Contiguous axial images were obtained from the base of the skull through the vertex  without intravenous contrast. RADIATION DOSE REDUCTION: This exam was performed according to the departmental dose-optimization program which includes automated exposure control, adjustment of the mA and/or kV according to patient size and/or use of iterative reconstruction technique. COMPARISON:  Head CT and CTA head and neck earlier today since 0033 hours. FINDINGS: Brain: Normal cerebral volume. Subtle new hypodensity left cerebellum PICA territory (coronal image 59 now versus series 5, image 60 on the initial head CT). No associated hemorrhage or mass effect. Elsewhere gray-white matter differentiation appears stable. No other acute or evolving infarct identified. No midline shift, ventriculomegaly, mass effect, evidence of mass lesion, or intracranial hemorrhage identified. Vascular: Calcified atherosclerosis at the skull base. Mild if any residual intravascular contrast. Skull: No acute osseous abnormality identified. Sinuses/Orbits: Visualized paranasal sinuses and mastoids are stable and well aerated. Other: Visualized orbits and scalp soft tissues are within normal limits. IMPRESSION: 1. Head CT appearance now highly suspicious for evolving Left PICA infarct. No hemorrhage or mass effect. 2. Elsewhere stable and negative CT appearance of the brain. 3. These results were communicated to Dr. Derry LoryKhaliqdina at 5:48 am on 05/31/2021 by text page via the [facility] messaging system. Electronically Signed   By: Odessa FlemingH  Hall M.D.   On: 05/31/2021 05:55  ? ?MR BRAIN WO CONTRAST ? ?Result Date: 05/31/2021 ?CLINICAL DATA:  Initial evaluation for acute neuro deficit, stroke suspected EXAM: MRI HEAD WITHOUT CONTRAST TECHNIQUE: Multiplanar, multiecho pulse sequences of the brain and surrounding structures were obtained without intravenous contrast. COMPARISON:  Comparison made with prior head CTs and CTA from earlier the same [place]. FINDINGS: Brain: Cerebral volume within normal limits. No significant cerebral white matter disease.  Confluent restricted diffusion involving the inferior left cerebellum consistent with an evolving acute left PICA distribution infarct (series 5, image 56). Area of infarction measures approximately 5.3 x 3.3 cm in size. Minimal patchy involvement of the left lateral margin of the medulla noted as well (series 5, image 55). No associated hemorrhage. Mild localized edema without significant regional mass effect at this time. Fourth ventricle and fourth ventricular outflow tract remain patent at this time. No other evidence for acute or subacute ischemia. Gray-white matter differentiation otherwise maintained. No encephalomalacia to suggest chronic cortical infarction or other insult. No acute or chronic intracranial blood products. No mass lesion or midline shift. No hydrocephalus or extra-axial fluid collection. Pituitary gland suprasellar region normal. Midline structures intact and normal. Vascular: Loss of normal flow void within the left V4 segment, consistent with previously identified occlusion. Major intracranial vascular flow voids otherwise maintained. Skull and upper cervical spine: Craniocervical junction within normal limits. Bone marrow signal intensity normal. No scalp soft tissue abnormality. Sinuses/Orbits: Conjugate right gaze noted. Globes orbital soft tissues demonstrate no other acute finding. Paranasal  sinuses are otherwise clear. No mastoid effusion. Inner ear structures grossly normal. Other: None. IMPRESSION: 1. Evolving acute left PICA distribution infarct involving the left cerebellum and adjacent left lateral medulla as above. No associated hemorrhage. Localized edema without significant regional mass effect. Fourth ventricle and fourth ventricular outflow tract remain patent at this time. No hydrocephalus. 2. Loss of normal flow void within the left V4 segment, consistent with previously identified occlusion. 3. Otherwise normal brain MRI. Electronically Signed   By: Rise Mu  M.D.   On: 05/31/2021 23:21  ? ?IR US Guide Vasc Access Right ? ?Result Date: 06/02/2021 ?INDICATION: Sylus Gedeon is a 51 year old male presented to the ED on 4.2.22 with dizziness, left sided weakness, nausea, vomi

## 2021-06-02 NOTE — Final Progress Note (Signed)
Pt and pt belongings DC to entrance A via wheelchair with transport. Pt given AVS paperwork and education. IV removed.  ?

## 2021-06-02 NOTE — Progress Notes (Signed)
Pt transported to IR via transport in bed with RN at this time. ?

## 2021-06-02 NOTE — Progress Notes (Signed)
PT Cancellation Note ? ?Patient Details ?Name: Frank Cuevas ?MRN: [id number] ?DOB: [birth year] ? ? ?Cancelled Treatment:    Reason Eval/Treat Not Completed: Patient at procedure or test/unavailable - will check back.  ? ?Stacie Glaze, PT DPT ?Acute Rehabilitation Services ?Pager [phone]  ?Office [phone] ? ? ? ?Dontray Haberland E Stroup ?06/02/2021, 10:27 AM ?

## 2021-06-02 NOTE — CV Procedure (Signed)
INTERVENTIONAL NEURORADIOLOGY BRIEF POSTPROCEDURE NOTE ? ?DIAGNOSTIC CEREBRAL ANGIOGRAM  ? ?Attending: Dr. Pedro Earls ? ?Diagnosis: Left vertebral artery occlusion.  ? ?Access site: Right common femoral artery.  ? ?Access closure: Manual pressure.  ? ?Anesthesia: Moderate sedation  ? ?Medication used: 1 mg Versed IV; 50 mcg Fentanyl IV. ? ?Complications: None.  ? ?Estimated blood loss: None.  ? ?Specimen: None.  ? ?Findings: Tapering of the left vertebral artery at the V3-V4 junction with filling defect along the V4 segment resulting in flow limitation.  Minimal flow into the left PICA.  Normal course, caliber and contrast opacification of the right vertebral artery and basilar artery.  No other significant vascular abnormality identified.  ? ?The patient tolerated the procedure well without incident or complication and is in stable condition. ? ? ?  ?

## 2021-06-02 NOTE — TOC Initial Note (Addendum)
Transition of Care (TOC) - Initial/Assessment Note  ? ? ?Patient Details  ?Name: Frank Cuevas ?MRN: [id number] ?Date of Birth: 02-Aug-1970 ? ?Transition of Care (TOC) CM/SW Contact:    ?Lawerance Sabal, RN ?Phone Number: ?06/02/2021, 1:14 PM ? ?Clinical Narrative:                Spoke to patient's wife over the phone. Patient would like to use an outpatient PT center in [place]. Requested wife to determine which OP PT center they would like to go to and call me back so I can assist with referral.  ? ?Referral made to [facility] [place] for PT ? ? ?Expected Discharge Plan: Home/Self Care ?Barriers to Discharge: Continued Medical Work up ? ? ?Patient Goals and CMS Choice ?  ?  ?  ? ?Expected Discharge Plan and Services ?Expected Discharge Plan: Home/Self Care ?  ?  ?  ?  ?                ?  ?  ?  ?  ?  ?  ?  ?  ?  ?  ? ?Prior Living Arrangements/Services ?  ?  ?  ?       ?  ?  ?  ?  ? ?Activities of Daily Living ?Home Assistive Devices/Equipment: Eyeglasses ?ADL Screening (condition at time of admission) ?Patient's cognitive ability adequate to safely complete daily activities?: Yes ?Is the patient deaf or have difficulty hearing?: No ?Does the patient have difficulty seeing, even when wearing glasses/contacts?: No ?Does the patient have difficulty concentrating, remembering, or making decisions?: No ?Patient able to express need for assistance with ADLs?: Yes ?Does the patient have difficulty dressing or bathing?: No ?Independently performs ADLs?: Yes (appropriate for developmental age) ?Does the patient have difficulty walking or climbing stairs?: No ?Weakness of Legs: None ?Weakness of Arms/Hands: None ? ?Permission Sought/Granted ?  ?  ?   ?   ?   ?   ? ?Emotional Assessment ?  ?  ?  ?  ?  ?  ? ?Admission diagnosis:  Hypokalemia [E87.6] ?Stroke determined by clinical assessment [facility]) [I63.9] ?Cerebrovascular accident (CVA), unspecified mechanism (HCC) [I63.9] ?Patient Active Problem List  ? Diagnosis Date Noted  ? Stroke  determined by clinical assessment (HCC) 05/31/2021  ? ?PCP:  Medicine, [facility] ?Pharmacy:   ?[id number] [place], [place] - [address] ?[address] ?[place] [address] ?Phone: [phone] Fax: [phone] ? ? ? ? ?Social Determinants of Health (SDOH) Interventions ?  ? ?Readmission Risk Interventions ?   ? View : No data to display.  ?  ?  ?  ? ? ? ?

## 2021-06-06 ENCOUNTER — Inpatient Hospital Stay (HOSPITAL_COMMUNITY)
Admission: EM | Admit: 2021-06-06 | Discharge: 2021-06-09 | DRG: 638 | Disposition: A | Discharge status: Home / Self Care | Payer: BC Managed Care – PPO | Attending: Internal Medicine | Admitting: Internal Medicine

## 2021-06-06 ENCOUNTER — Encounter (HOSPITAL_COMMUNITY): Payer: Self-pay

## 2021-06-06 ENCOUNTER — Emergency Department (HOSPITAL_COMMUNITY): Payer: BC Managed Care – PPO

## 2021-06-06 ENCOUNTER — Observation Stay (HOSPITAL_COMMUNITY): Payer: BC Managed Care – PPO

## 2021-06-06 ENCOUNTER — Other Ambulatory Visit: Payer: Self-pay

## 2021-06-06 DIAGNOSIS — E785 Hyperlipidemia, unspecified: Secondary | ICD-10-CM | POA: Diagnosis present

## 2021-06-06 DIAGNOSIS — Z8616 Personal history of COVID-19: Secondary | ICD-10-CM

## 2021-06-06 DIAGNOSIS — Z8673 Personal history of transient ischemic attack (TIA), and cerebral infarction without residual deficits: Secondary | ICD-10-CM

## 2021-06-06 DIAGNOSIS — Z800 Family history of malignant neoplasm of digestive organs: Secondary | ICD-10-CM

## 2021-06-06 DIAGNOSIS — I10 Essential (primary) hypertension: Secondary | ICD-10-CM | POA: Diagnosis present

## 2021-06-06 DIAGNOSIS — Z7984 Long term (current) use of oral hypoglycemic drugs: Secondary | ICD-10-CM

## 2021-06-06 DIAGNOSIS — E1110 Type 2 diabetes mellitus with ketoacidosis without coma: Secondary | ICD-10-CM | POA: Diagnosis not present

## 2021-06-06 DIAGNOSIS — E860 Dehydration: Secondary | ICD-10-CM | POA: Diagnosis present

## 2021-06-06 DIAGNOSIS — T383X5A Adverse effect of insulin and oral hypoglycemic [antidiabetic] drugs, initial encounter: Secondary | ICD-10-CM | POA: Diagnosis present

## 2021-06-06 DIAGNOSIS — Z7982 Long term (current) use of aspirin: Secondary | ICD-10-CM

## 2021-06-06 DIAGNOSIS — Z91018 Allergy to other foods: Secondary | ICD-10-CM

## 2021-06-06 DIAGNOSIS — N179 Acute kidney failure, unspecified: Secondary | ICD-10-CM | POA: Diagnosis present

## 2021-06-06 DIAGNOSIS — Z791 Long term (current) use of non-steroidal anti-inflammatories (NSAID): Secondary | ICD-10-CM

## 2021-06-06 DIAGNOSIS — E873 Alkalosis: Secondary | ICD-10-CM | POA: Diagnosis present

## 2021-06-06 DIAGNOSIS — Z79899 Other long term (current) drug therapy: Secondary | ICD-10-CM

## 2021-06-06 LAB — BASIC METABOLIC PANEL
Anion gap: 17 — ABNORMAL HIGH (ref 5–15)
Anion gap: 17 — ABNORMAL HIGH (ref 5–15)
BUN: 24 mg/dL — ABNORMAL HIGH (ref 6–20)
BUN: 23 mg/dL — ABNORMAL HIGH (ref 6–20)
BUN: 22 mg/dL — ABNORMAL HIGH (ref 6–20)
CO2: <7 mmol/L — ABNORMAL LOW (ref 22–32)
CO2: 7 mmol/L — ABNORMAL LOW (ref 22–32)
CO2: 8 mmol/L — ABNORMAL LOW (ref 22–32)
Calcium: 8.7 mg/dL — ABNORMAL LOW (ref 8.9–10.3)
Calcium: 8.7 mg/dL — ABNORMAL LOW (ref 8.9–10.3)
Calcium: 8.8 mg/dL — ABNORMAL LOW (ref 8.9–10.3)
Chloride: 109 mmol/L (ref 98–111)
Chloride: 109 mmol/L (ref 98–111)
Chloride: 110 mmol/L (ref 98–111)
Creatinine, Ser: 1.17 mg/dL (ref 0.61–1.24)
Creatinine, Ser: 1.12 mg/dL (ref 0.61–1.24)
Creatinine, Ser: 0.96 mg/dL (ref 0.61–1.24)
GFR, Estimated: >60 mL/min (ref >60)
GFR, Estimated: >60 mL/min (ref >60)
GFR, Estimated: >60 mL/min (ref >60)
Glucose, Bld: 160 mg/dL — ABNORMAL HIGH (ref 70–99)
Glucose, Bld: 124 mg/dL — ABNORMAL HIGH (ref 70–99)
Glucose, Bld: 126 mg/dL — ABNORMAL HIGH (ref 70–99)
Potassium: 5 mmol/L (ref 3.5–5.1)
Potassium: 4.5 mmol/L (ref 3.5–5.1)
Potassium: 4.4 mmol/L (ref 3.5–5.1)
Sodium: 133 mmol/L — ABNORMAL LOW (ref 135–145)
Sodium: 133 mmol/L — ABNORMAL LOW (ref 135–145)
Sodium: 135 mmol/L (ref 135–145)

## 2021-06-06 LAB — COMPREHENSIVE METABOLIC PANEL WITH GFR
ALT: 15 U/L (ref 0–44)
AST: 27 U/L (ref 15–41)
Albumin: 4.2 g/dL (ref 3.5–5.0)
Alkaline Phosphatase: 142 U/L — ABNORMAL HIGH (ref 38–126)
BUN: 26 mg/dL — ABNORMAL HIGH (ref 6–20)
CO2: <7 mmol/L — ABNORMAL LOW (ref 22–32)
Calcium: 9.1 mg/dL (ref 8.9–10.3)
Chloride: 102 mmol/L (ref 98–111)
Creatinine, Ser: 1.37 mg/dL — ABNORMAL HIGH (ref 0.61–1.24)
GFR, Estimated: >60 mL/min
Glucose, Bld: 195 mg/dL — ABNORMAL HIGH (ref 70–99)
Potassium: 5.6 mmol/L — ABNORMAL HIGH (ref 3.5–5.1)
Sodium: 133 mmol/L — ABNORMAL LOW (ref 135–145)
Total Bilirubin: 2.4 mg/dL — ABNORMAL HIGH (ref 0.3–1.2)
Total Protein: 7.6 g/dL (ref 6.5–8.1)

## 2021-06-06 LAB — GLUCOSE, CAPILLARY
Glucose-Capillary: 134 mg/dL — ABNORMAL HIGH (ref 70–99)
Glucose-Capillary: 139 mg/dL — ABNORMAL HIGH (ref 70–99)
Glucose-Capillary: 120 mg/dL — ABNORMAL HIGH (ref 70–99)
Glucose-Capillary: 140 mg/dL — ABNORMAL HIGH (ref 70–99)

## 2021-06-06 LAB — I-STAT CHEM 8, ED
BUN: 36 mg/dL — ABNORMAL HIGH (ref 6–20)
Calcium, Ion: 1.14 mmol/L — ABNORMAL LOW (ref 1.15–1.40)
Chloride: 109 mmol/L (ref 98–111)
Creatinine, Ser: 0.6 mg/dL — ABNORMAL LOW (ref 0.61–1.24)
Glucose, Bld: 185 mg/dL — ABNORMAL HIGH (ref 70–99)
HCT: 56 % — ABNORMAL HIGH (ref 39.0–52.0)
Hemoglobin: 19 g/dL — ABNORMAL HIGH (ref 13.0–17.0)
Potassium: 5 mmol/L (ref 3.5–5.1)
Sodium: 133 mmol/L — ABNORMAL LOW (ref 135–145)
TCO2: 7 mmol/L — ABNORMAL LOW (ref 22–32)

## 2021-06-06 LAB — URINALYSIS, MICROSCOPIC (REFLEX)
Bacteria, UA: NONE SEEN
Squamous Epithelial / HPF: NONE SEEN (ref 0–5)
WBC, UA: NONE SEEN WBC/hpf (ref 0–5)

## 2021-06-06 LAB — DIFFERENTIAL
Abs Immature Granulocytes: 0.09 10*3/uL — ABNORMAL HIGH (ref 0.00–0.07)
Basophils Absolute: 0 10*3/uL (ref 0.0–0.1)
Basophils Relative: 0 %
Eosinophils Absolute: 0 10*3/uL (ref 0.0–0.5)
Eosinophils Relative: 0 %
Immature Granulocytes: 1 %
Lymphocytes Relative: 8 %
Lymphs Abs: 0.9 10*3/uL (ref 0.7–4.0)
Monocytes Absolute: 0.8 10*3/uL (ref 0.1–1.0)
Monocytes Relative: 8 %
Neutro Abs: 9 10*3/uL — ABNORMAL HIGH (ref 1.7–7.7)
Neutrophils Relative %: 83 %

## 2021-06-06 LAB — ETHANOL: Alcohol, Ethyl (B): <10 mg/dL (ref <10)

## 2021-06-06 LAB — PHOSPHORUS: Phosphorus: 1.9 mg/dL — ABNORMAL LOW (ref 2.5–4.6)

## 2021-06-06 LAB — I-STAT VENOUS BLOOD GAS, ED
Acid-base deficit: 25 mmol/L — ABNORMAL HIGH (ref 0.0–2.0)
Bicarbonate: 5.4 mmol/L — ABNORMAL LOW (ref 20.0–28.0)
Calcium, Ion: 1.08 mmol/L — ABNORMAL LOW (ref 1.15–1.40)
HCT: 57 % — ABNORMAL HIGH (ref 39.0–52.0)
Hemoglobin: 19.4 g/dL — ABNORMAL HIGH (ref 13.0–17.0)
O2 Saturation: 94 %
Potassium: 5.1 mmol/L (ref 3.5–5.1)
Sodium: 133 mmol/L — ABNORMAL LOW (ref 135–145)
TCO2: 6 mmol/L — ABNORMAL LOW (ref 22–32)
pCO2, Ven: 21.9 mmHg — ABNORMAL LOW (ref 44–60)
pH, Ven: 6.997 — CL (ref 7.25–7.43)
pO2, Ven: 101 mmHg — ABNORMAL HIGH (ref 32–45)

## 2021-06-06 LAB — CBC
HCT: 57.6 % — ABNORMAL HIGH (ref 39.0–52.0)
Hemoglobin: 18.5 g/dL — ABNORMAL HIGH (ref 13.0–17.0)
MCH: 29.6 pg (ref 26.0–34.0)
MCHC: 32.1 g/dL (ref 30.0–36.0)
MCV: 92 fL (ref 80.0–100.0)
Platelets: 373 K/uL (ref 150–400)
RBC: 6.26 MIL/uL — ABNORMAL HIGH (ref 4.22–5.81)
RDW: 12.9 % (ref 11.5–15.5)
WBC: 10.8 K/uL — ABNORMAL HIGH (ref 4.0–10.5)
nRBC: 0 % (ref 0.0–0.2)

## 2021-06-06 LAB — URINALYSIS, ROUTINE W REFLEX MICROSCOPIC
Bilirubin Urine: NEGATIVE
Glucose, UA: >=500 mg/dL — AB
Ketones, ur: >80 mg/dL — AB
Leukocytes,Ua: NEGATIVE
Nitrite: NEGATIVE
Protein, ur: 30 mg/dL — AB
Specific Gravity, Urine: 1.025 (ref 1.005–1.030)
pH: 5.5 (ref 5.0–8.0)

## 2021-06-06 LAB — I-STAT BETA HCG BLOOD, ED (MC, WL, AP ONLY): I-stat hCG, quantitative: <5 m[IU]/mL

## 2021-06-06 LAB — D-DIMER, QUANTITATIVE: D-Dimer, Quant: 1.1 ug/mL-FEU — ABNORMAL HIGH (ref 0.00–0.50)

## 2021-06-06 LAB — LACTIC ACID, PLASMA
Lactic Acid, Venous: 2.4 mmol/L (ref 0.5–1.9)
Lactic Acid, Venous: 3.1 mmol/L (ref 0.5–1.9)
Lactic Acid, Venous: 1.7 mmol/L (ref 0.5–1.9)
Lactic Acid, Venous: 2 mmol/L (ref 0.5–1.9)

## 2021-06-06 LAB — SALICYLATE LEVEL: Salicylate Lvl: <7 mg/dL — ABNORMAL LOW (ref 7.0–30.0)

## 2021-06-06 LAB — PROTIME-INR
INR: 1.1 (ref 0.8–1.2)
Prothrombin Time: 14.4 s (ref 11.4–15.2)

## 2021-06-06 LAB — CBG MONITORING, ED
Glucose-Capillary: 215 mg/dL — ABNORMAL HIGH (ref 70–99)
Glucose-Capillary: 187 mg/dL — ABNORMAL HIGH (ref 70–99)
Glucose-Capillary: 182 mg/dL — ABNORMAL HIGH (ref 70–99)
Glucose-Capillary: 159 mg/dL — ABNORMAL HIGH (ref 70–99)
Glucose-Capillary: 139 mg/dL — ABNORMAL HIGH (ref 70–99)
Glucose-Capillary: 131 mg/dL — ABNORMAL HIGH (ref 70–99)

## 2021-06-06 LAB — MAGNESIUM: Magnesium: 2.1 mg/dL (ref 1.7–2.4)

## 2021-06-06 LAB — TROPONIN I (HIGH SENSITIVITY)
Troponin I (High Sensitivity): 8 ng/L (ref <18)
Troponin I (High Sensitivity): 5 ng/L (ref <18)

## 2021-06-06 LAB — APTT: aPTT: 25 seconds (ref 24–36)

## 2021-06-06 IMAGING — CT CT HEAD W/O CM
3 series · 15 of 47 positions shown, 18 images · non-contrast
Comparison: [DATE]

CLINICAL DATA: Left PICA infarction 1 week ago, new onset dizziness



[Series 4: head 5.0 h30s · axial · 0.45mm/px · z∈[-80,+60]mm · 9 of 34 slices shown, 12 images]
[im 3/34  brain]
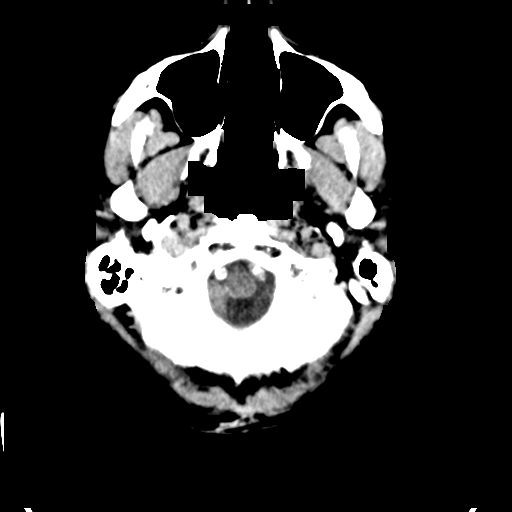
[im 3/34  bone]
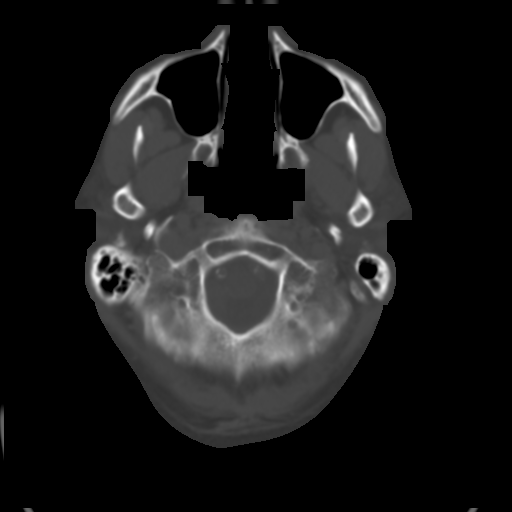
[im 6/34  brain]
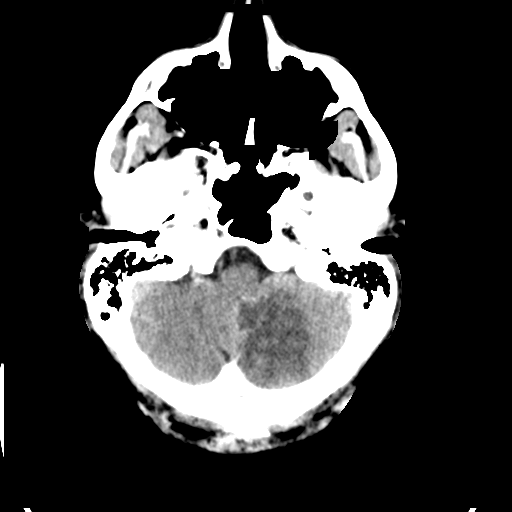
[im 10/34  brain]
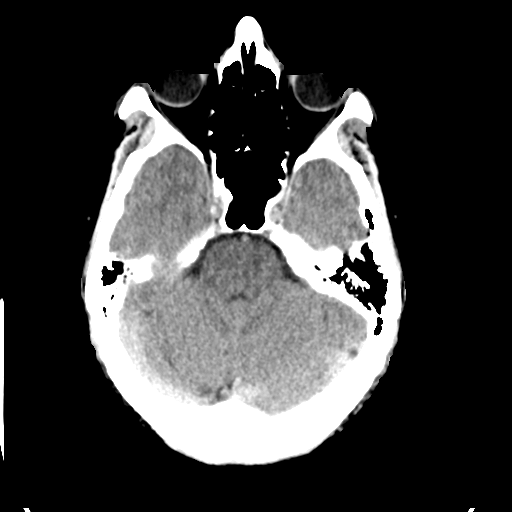
[im 13/34  brain]
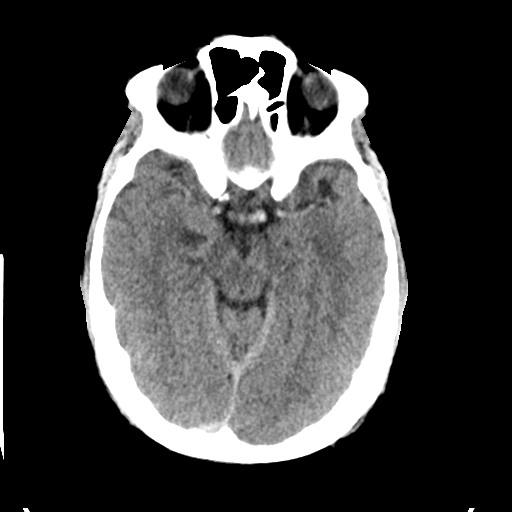
[im 18/34  brain]
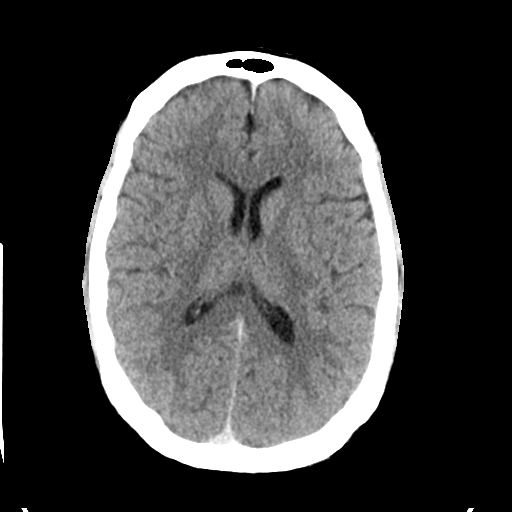
[im 18/34  bone]
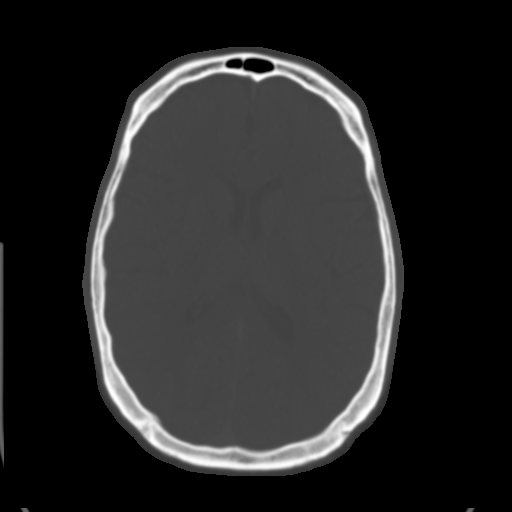
[im 21/34  brain]
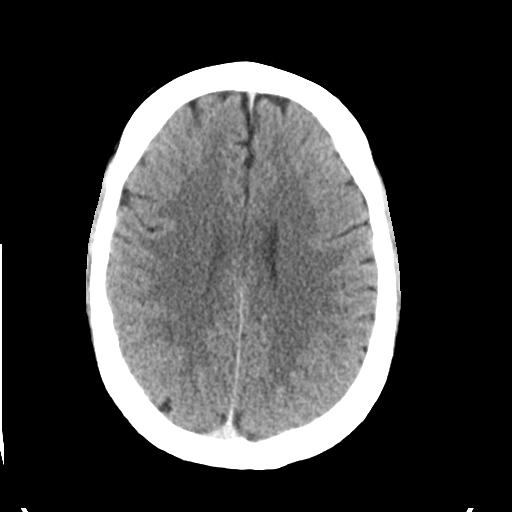
[im 24/34  brain]
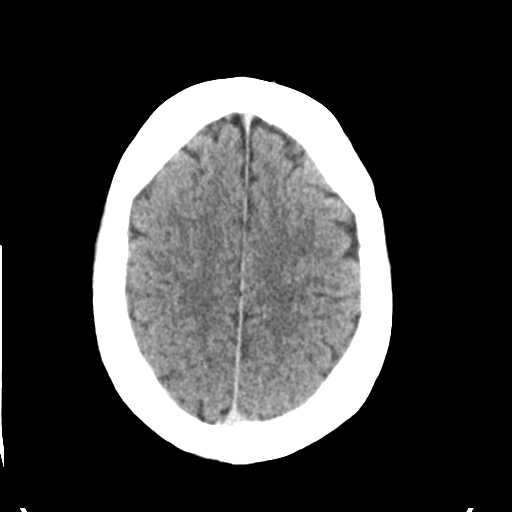
[im 28/34  brain]
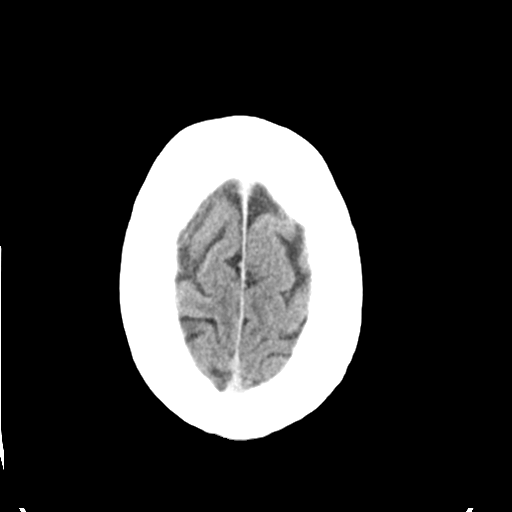
[im 31/34  brain]
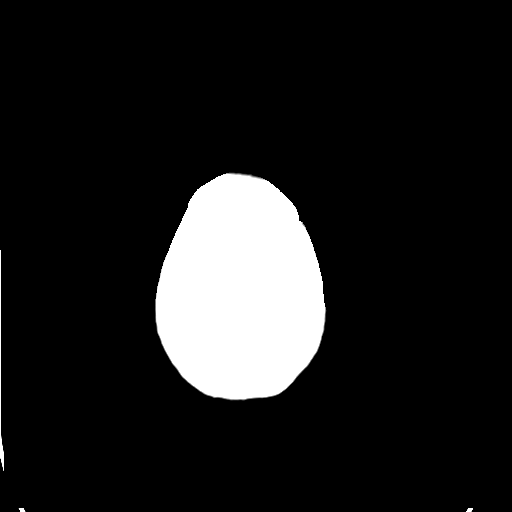
[im 31/34  bone]
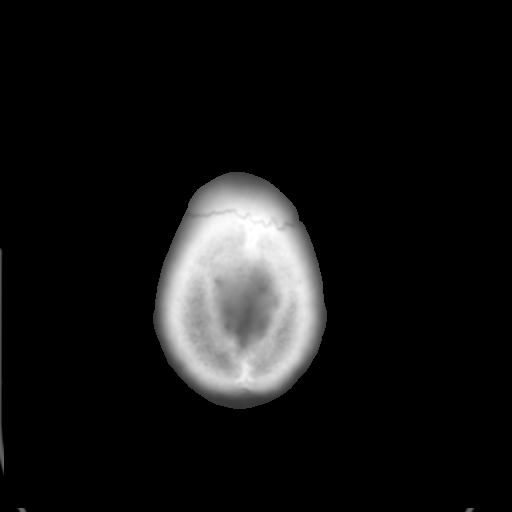

[Series 5: head 3.0 mpr cor · coronal · 0.33mm/px · 3 of 74 slices shown]
[im 25/74  brain]
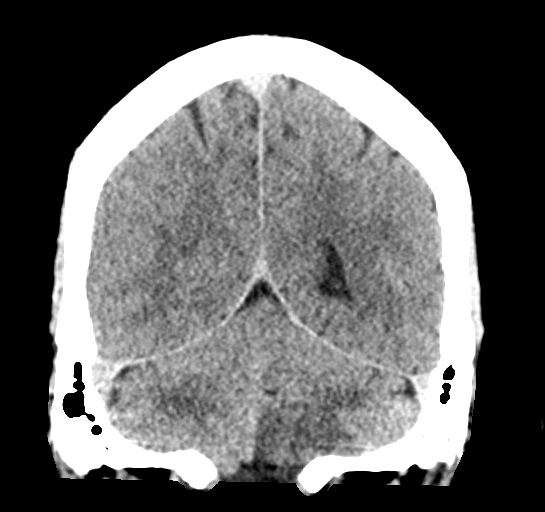
[im 33/74  brain]
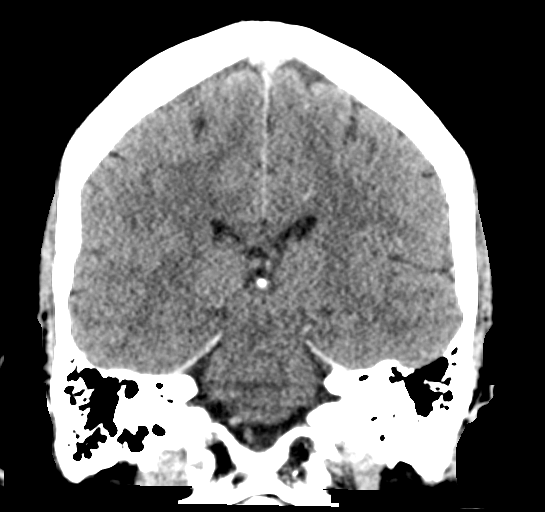
[im 41/74  brain]
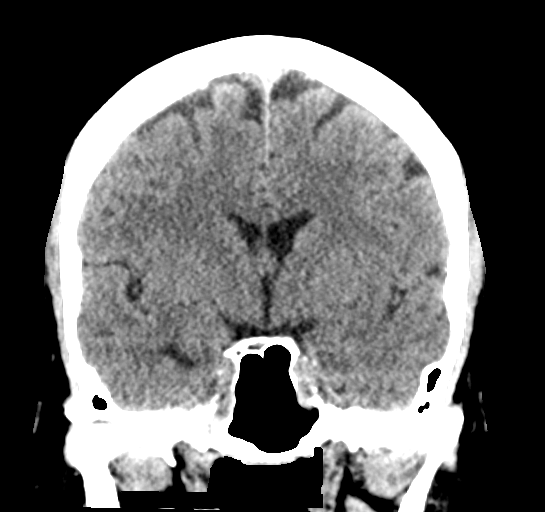

[Series 6: head 3.0 mpr sag · sagittal · 0.33mm/px · 3 of 59 slices shown]
[im 20/59  brain]
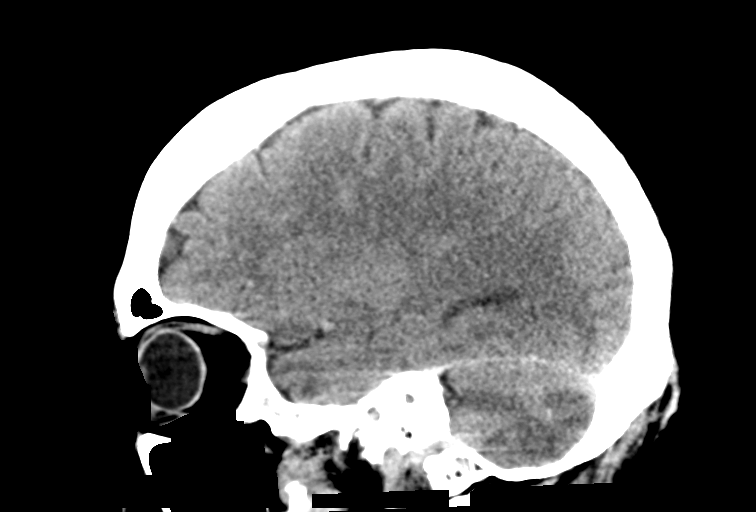
[im 30/59  brain]
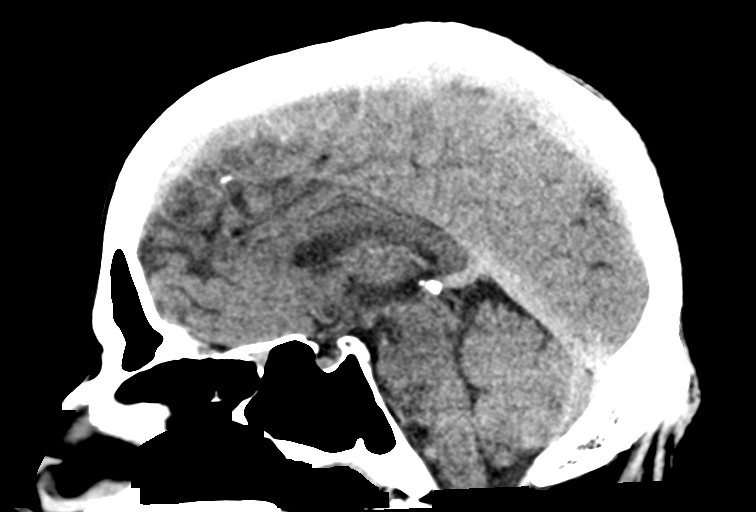
[im 39/59  brain]
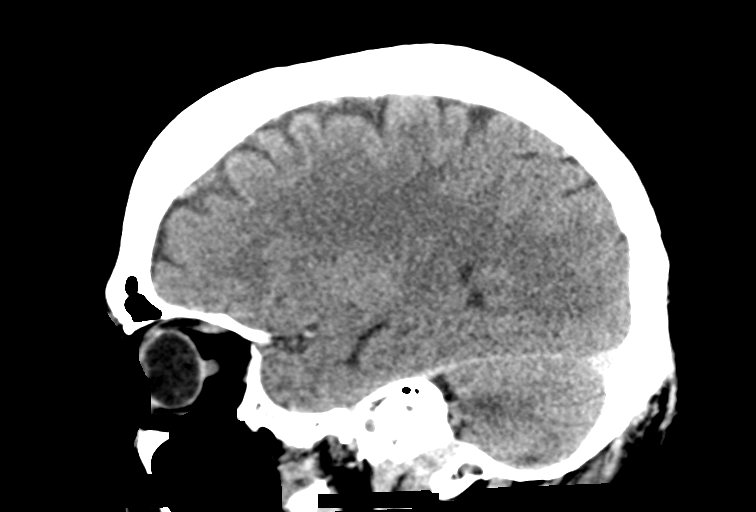

[15 of 47 positions shown; findings below may reference images not displayed]

FINDINGS: Brain: Increased hypodensity of a left cerebellar hemisphere
infarction (series 4, image 6). No evidence of hemorrhage,
hydrocephalus, extra-axial collection or mass lesion/mass effect.

Vascular: No hyperdense vessel or unexpected calcification.

Skull: Normal. Negative for fracture or focal lesion.

Sinuses/Orbits: No acute finding.

Other: None.
IMPRESSION: Increased hypodensity of a left cerebellar hemisphere infarction, in
keeping with expected evolution of acute to subacute infarction. No
evidence of hemorrhagic transformation or other acute intracranial
pathology. Consider MRI to more sensitively evaluate for acutely
superimposed diffusion restricting infarction.

## 2021-06-06 IMAGING — DX DG CHEST 1V PORT
1 series · 2 of 2 positions shown · non-contrast
Comparison: None.

CLINICAL DATA: Shortness of breath and dyspnea

EXAM:
PORTABLE CHEST 1 VIEW

[Series 1: chest ap · 0.14mm/px · 2 of 2 slices shown]
[im 1/2]
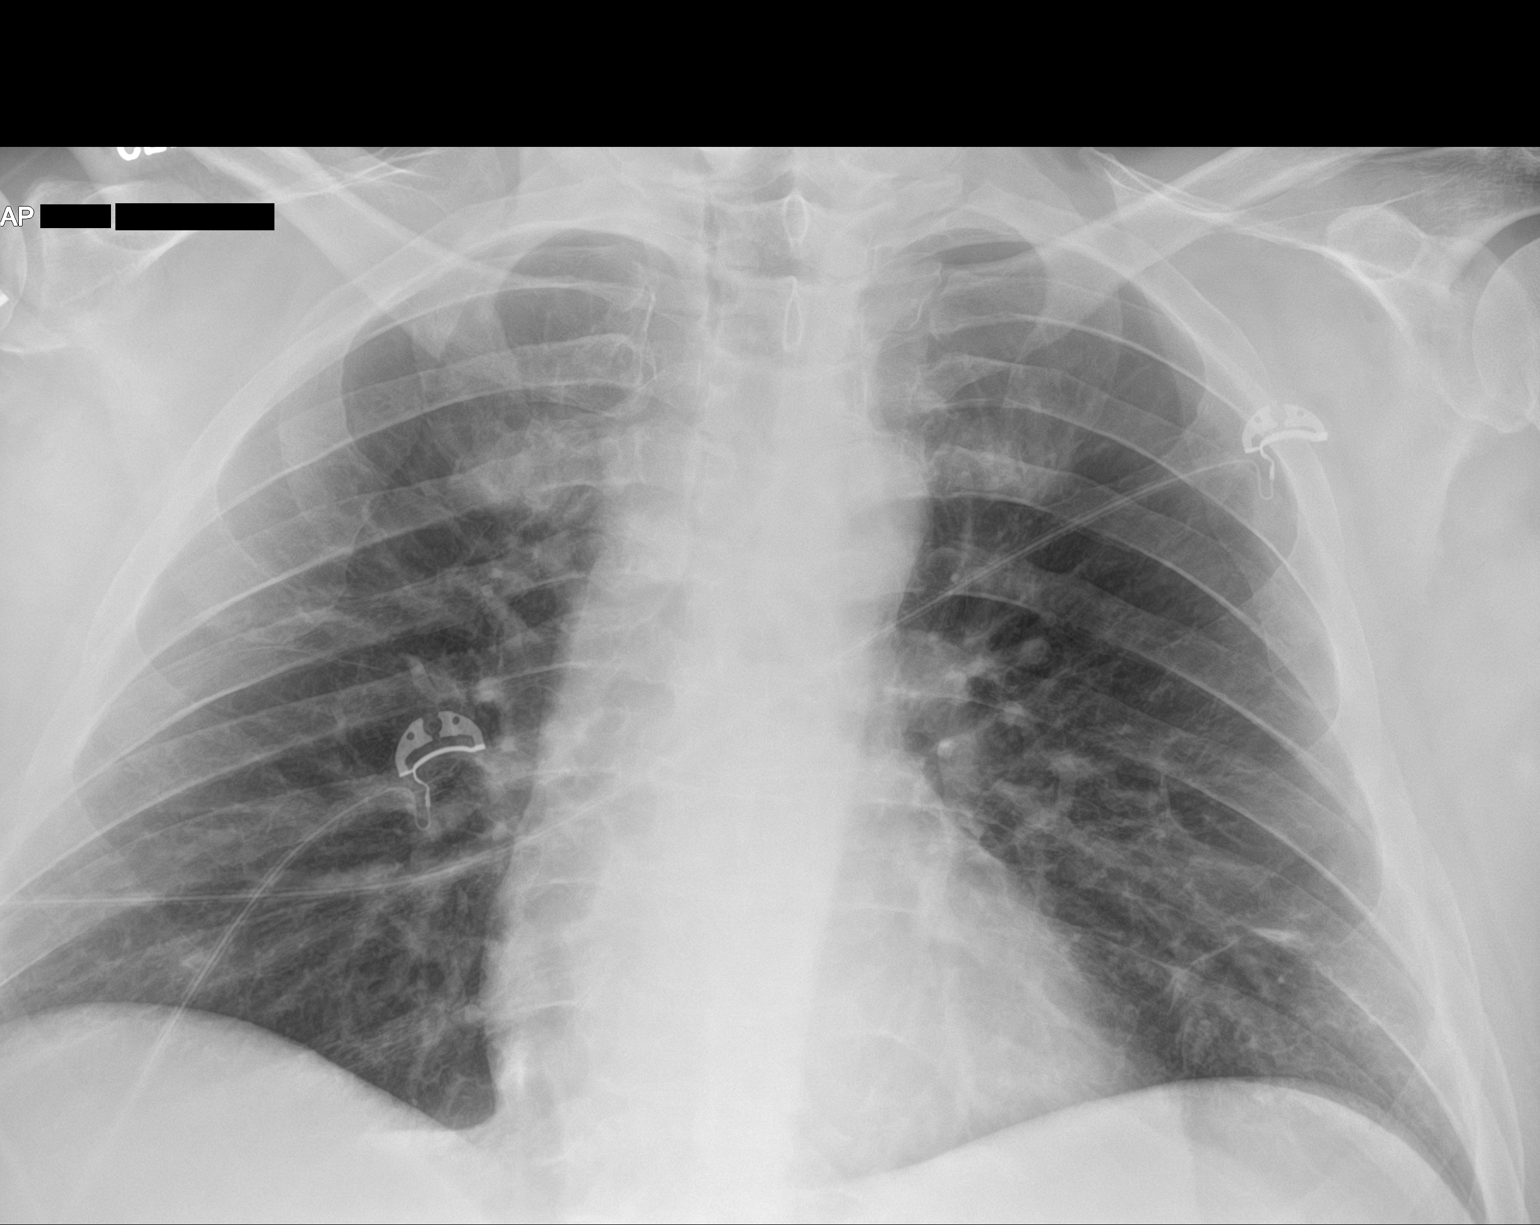
[im 2/2]
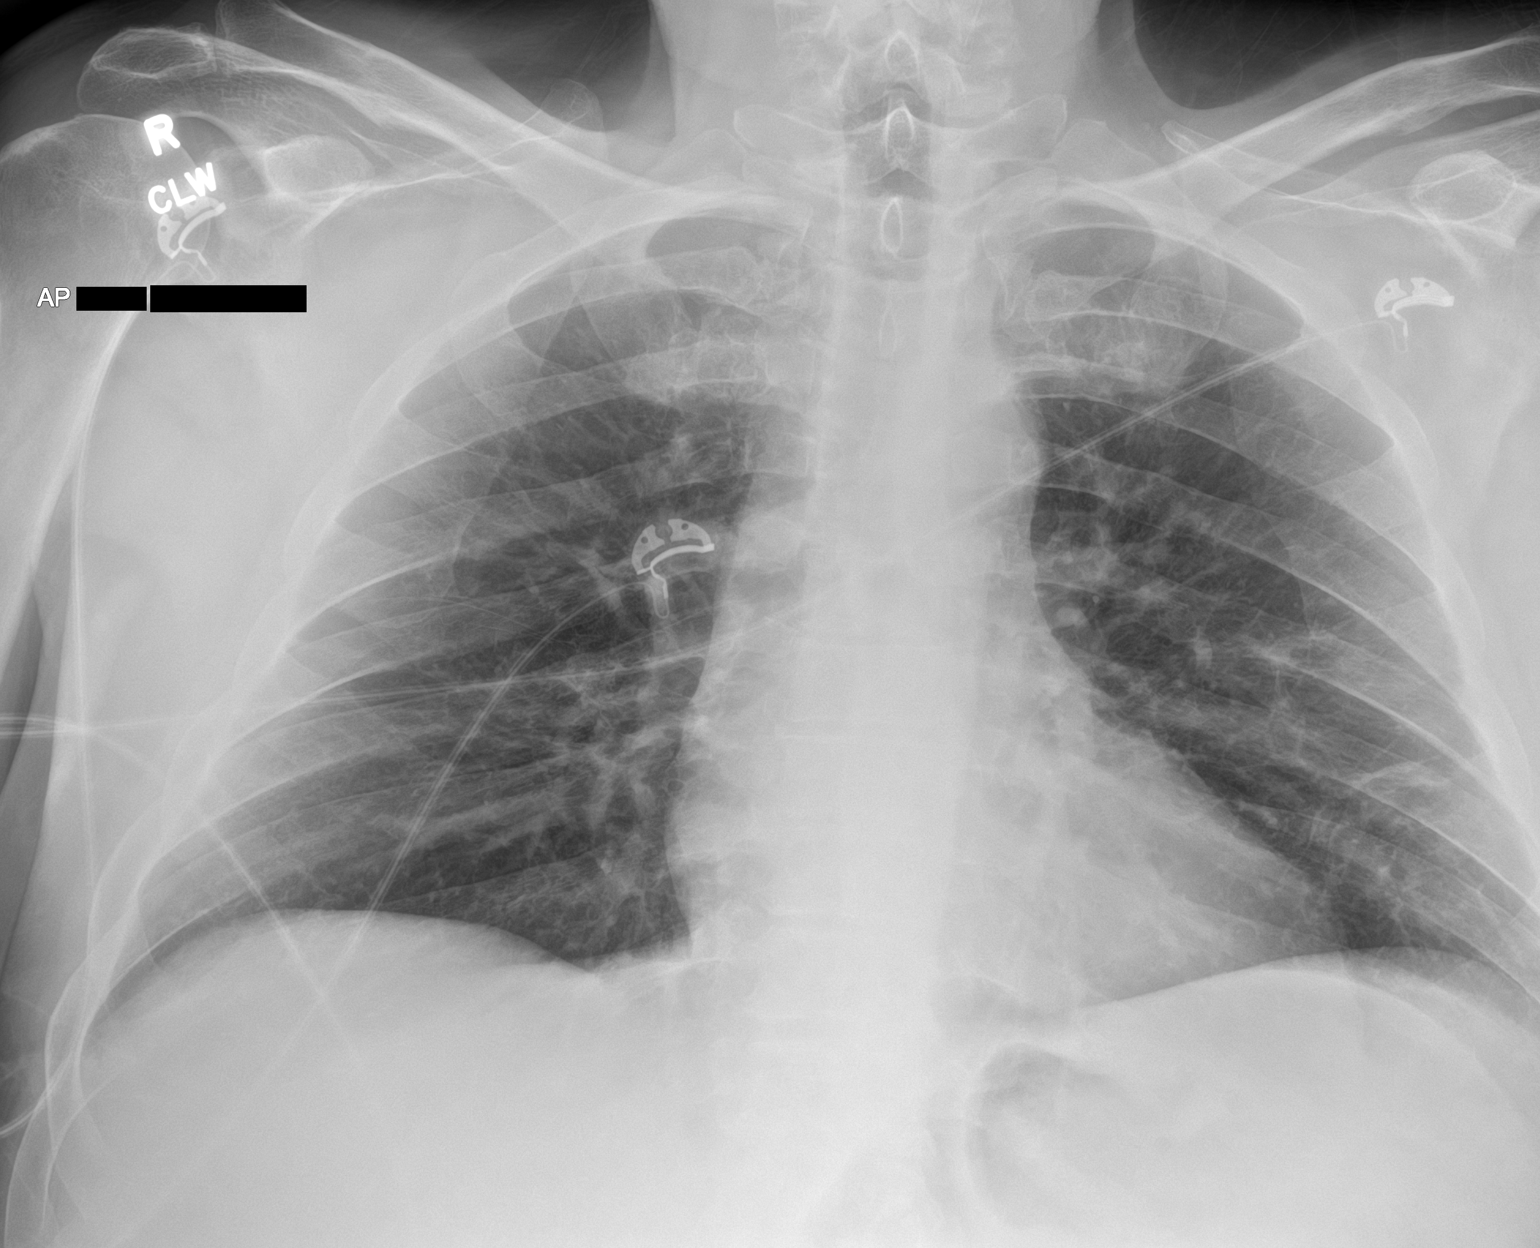

[2 of 2 positions shown; findings below may reference images not displayed]

FINDINGS: The cardiomediastinal silhouette is the upper limits of normal in
contour. No pleural effusion. No pneumothorax. Band like opacity in
the LEFT mid lung. Visualized abdomen is unremarkable.
IMPRESSION: Band like opacity in the LEFT mid lung, likely atelectasis.

## 2021-06-06 IMAGING — CT CT ANGIO HEAD-NECK (W OR W/O PERF)
1 of 8 series · 14 of 47 positions shown · non-contrast
Comparison: Head CT from earlier today. CTA of the neck from 6 days
ago.

CLINICAL DATA: Dizziness beginning this morning

EXAM:
CT ANGIOGRAPHY HEAD AND NECK
TECHNIQUE: Multidetector CT imaging of the head and neck was performed using
the standard protocol during bolus administration of intravenous
contrast. Multiplanar CT image reconstructions and MIPs were
obtained to evaluate the vascular anatomy. Carotid stenosis
measurements (when applicable) are obtained utilizing NASCET
criteria, using the distal internal carotid diameter as the
denominator.

[Series 12: thin · axial · 0.53mm/px · z∈[-298,+30]mm · 14 of 759 slices shown]
[im 51/759  brain]
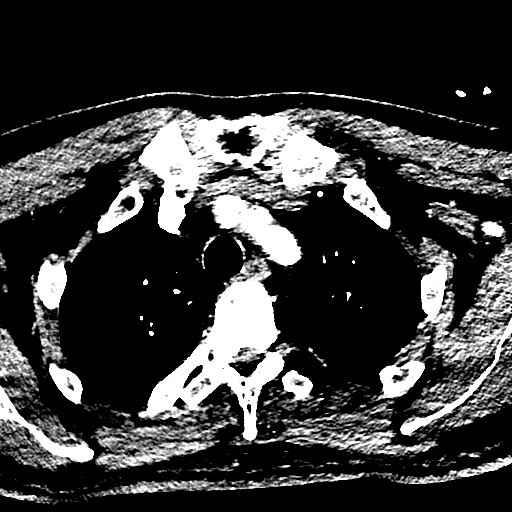
[im 102/759  bone]
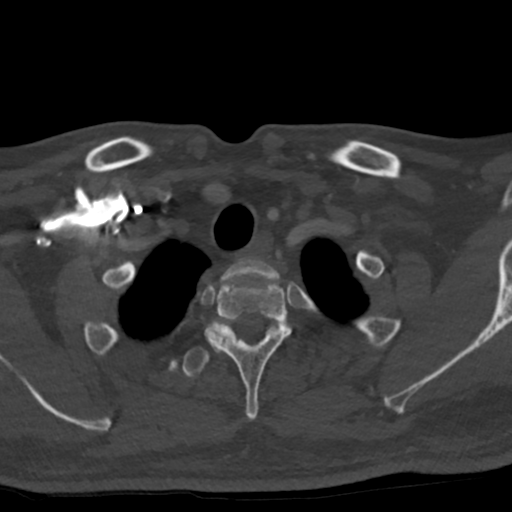
[im 152/759  brain]
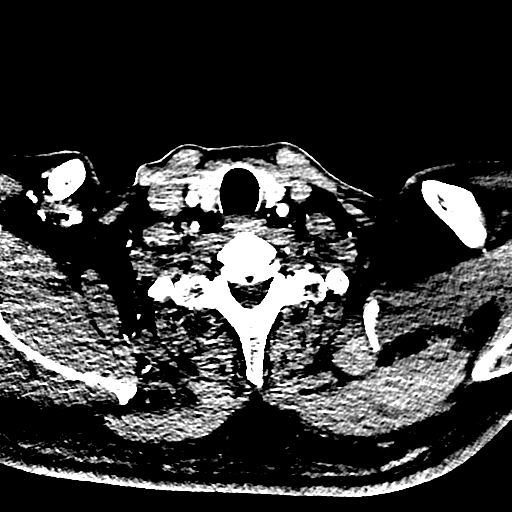
[im 203/759  bone]
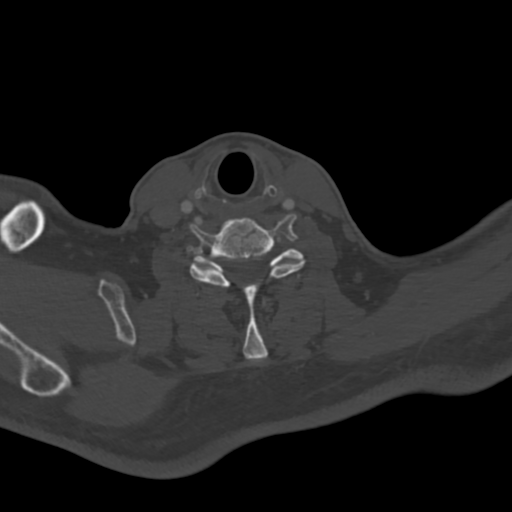
[im 253/759  brain]
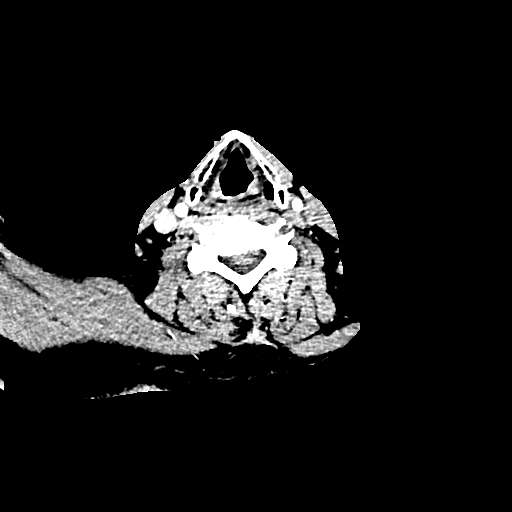
[im 304/759  bone]
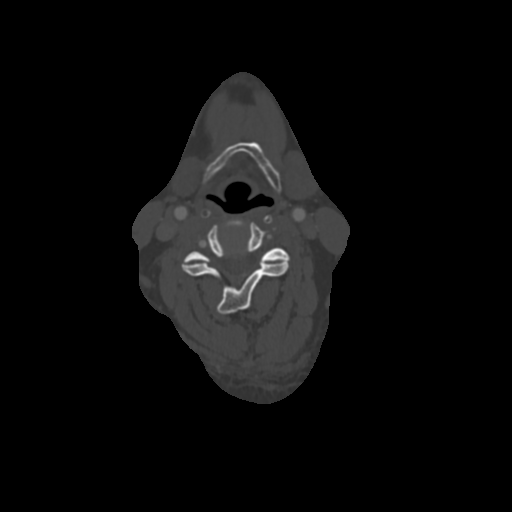
[im 354/759  brain]
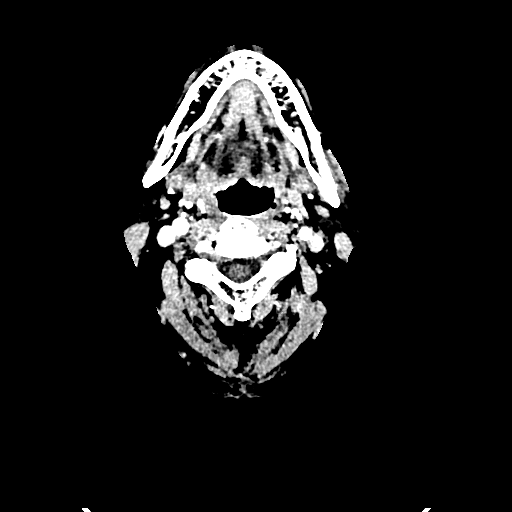
[im 405/759  bone]
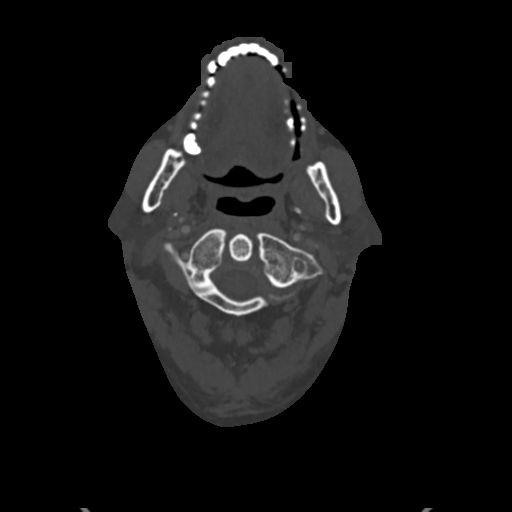
[im 455/759  brain]
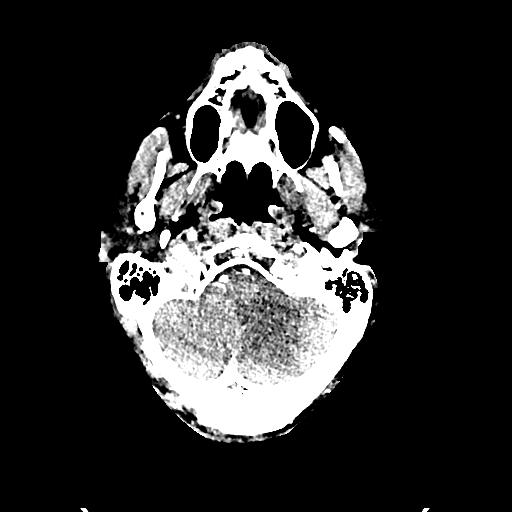
[im 506/759  bone]
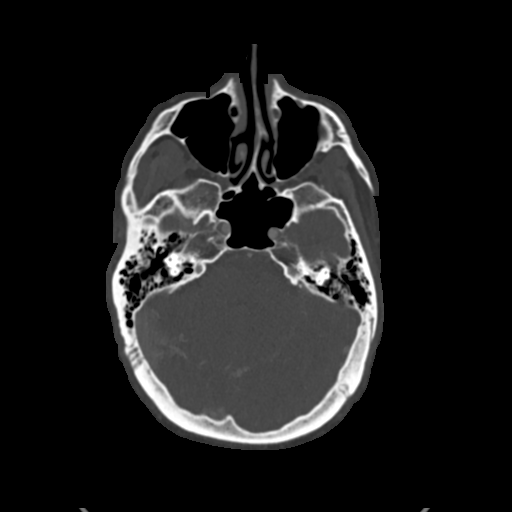
[im 556/759  brain]
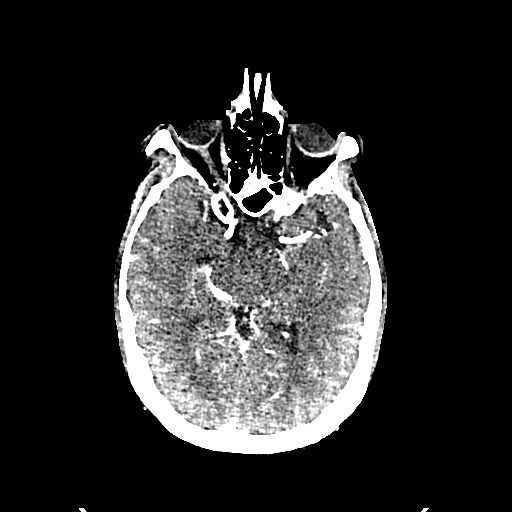
[im 607/759  bone]
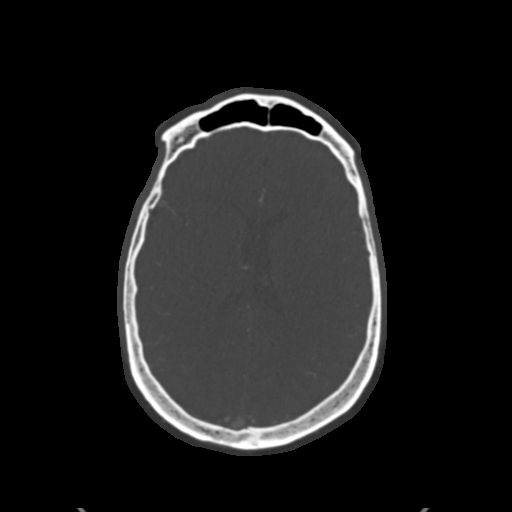
[im 657/759  brain]
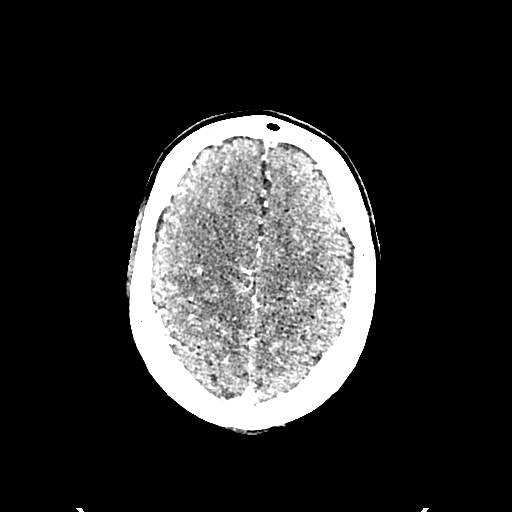
[im 708/759  bone]
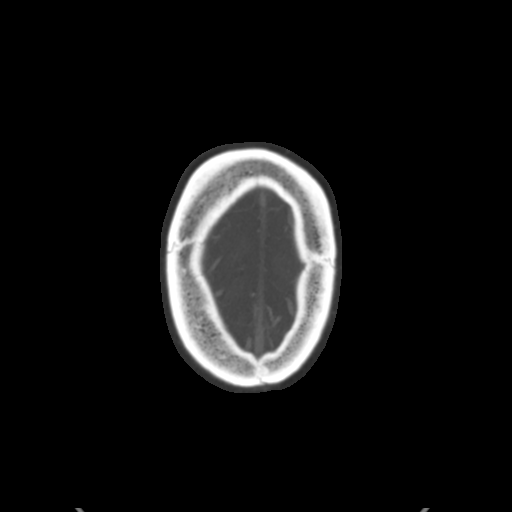

[14 of 47 positions shown; findings below may reference images not displayed]

RADIATION DOSE REDUCTION: This exam was performed according to the
departmental dose-optimization program which includes automated
exposure control, adjustment of the mA and/or kV according to
patient size and/or use of iterative reconstruction technique.

CONTRAST:  75mL OMNIPAQUE IOHEXOL 350 MG/ML SOLN
FINDINGS: CTA NECK FINDINGS

Aortic arch: Normal with 3 vessel arch.

Right carotid system: Minimal plaque at the bifurcation. No
stenosis, ulceration, or beading

Left carotid system: Mild low-density plaque at the distal common
carotid and bifurcation. No stenosis, ulceration, or beading.

Vertebral arteries: No proximal subclavian stenosis. Dominant right
vertebral artery. Unchanged attenuation of the left vertebral artery
at the V3 segment. Widely patent right vertebral artery

Skeleton: No acute or aggressive finding

Other neck: No acute finding

Upper chest: Negative

Review of the MIP images confirms the above findings

CTA HEAD FINDINGS

Anterior circulation: Atheromatous plaque along the carotid siphons
with approximately 50% stenosis at the left cavernous ICA. No branch
occlusion, beading, or aneurysm.

Posterior circulation: Attenuated left vertebral artery to the level
of the proximal V4 segment where there has been reconstitution of
flow. Moderate narrowing of the right vertebral artery before the
basilar, quantification limited by small vessel size. The basilar is
widely patent with overall mild atheromatous irregularity of the
bilateral PCA. No branch occlusion, beading, or aneurysm is seen.

Venous sinuses: Diffusely patent

Anatomic variants: None significant

Review of the MIP images confirms the above findings
IMPRESSION: 1. No emergent vascular finding. There is actually improved flow in
the left V4 segment beyond the patient's known dissection.
2. Moderate atheromatous narrowing of the left cavernous ICA and
right vertebral artery before the basilar.

## 2021-06-06 IMAGING — CT CT ANGIO CHEST
2 of 7 series · 18 of 46 positions shown · IV contrast (agent unspecified)
Comparison: None.

CLINICAL DATA: Elevated D-dimer.

EXAM:
CT ANGIOGRAPHY CHEST WITH CONTRAST
TECHNIQUE: Multidetector CT imaging of the chest was performed using the
standard protocol during bolus administration of intravenous
contrast. Multiplanar CT image reconstructions and MIPs were
obtained to evaluate the vascular anatomy.

[Series 6: thins · axial · 0.78mm/px · z∈[-1388,-1109]mm · 15 of 449 slices shown]
[im 25/449  lung]
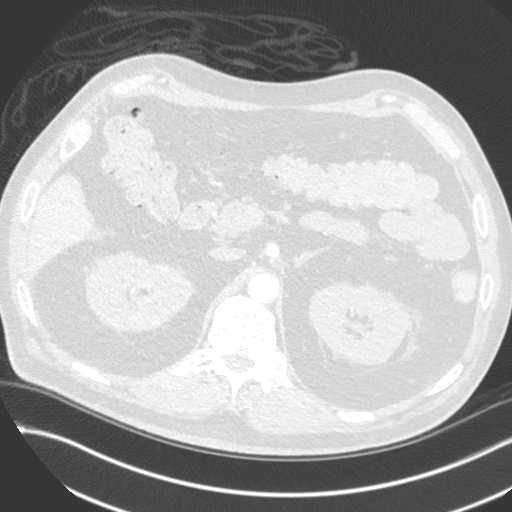
[im 50/449  soft-tissue]
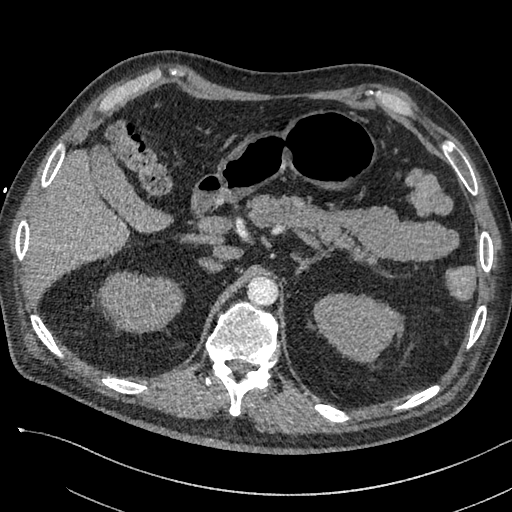
[im 75/449  lung]
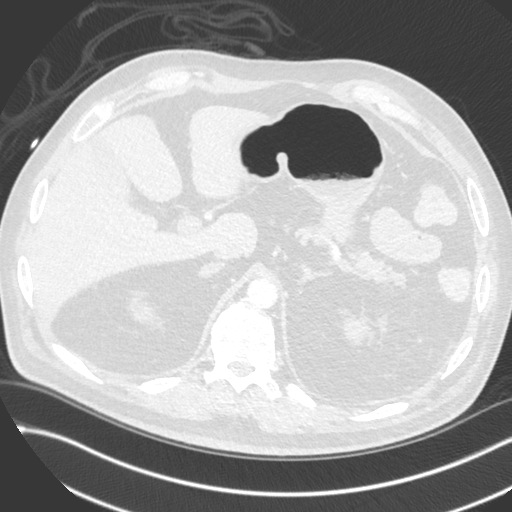
[im 100/449  soft-tissue]
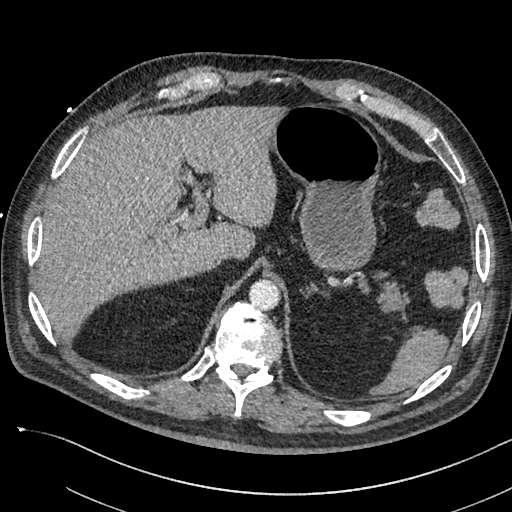
[im 150/449  lung]
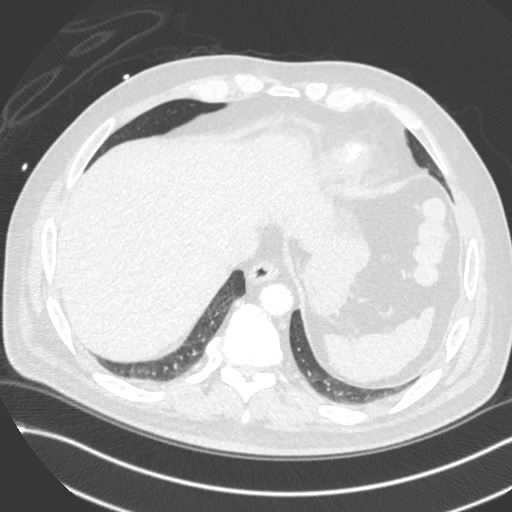
[im 175/449  soft-tissue]
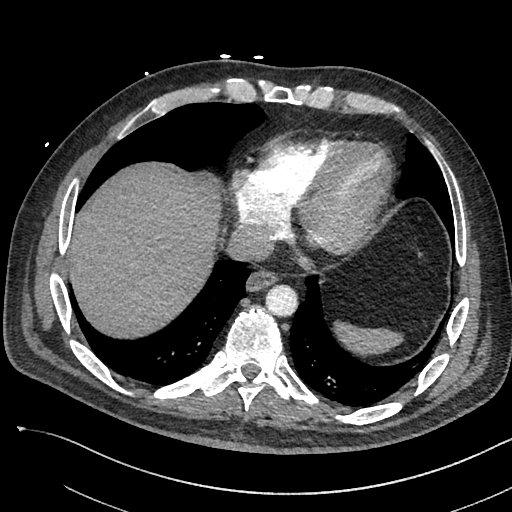
[im 200/449  lung]
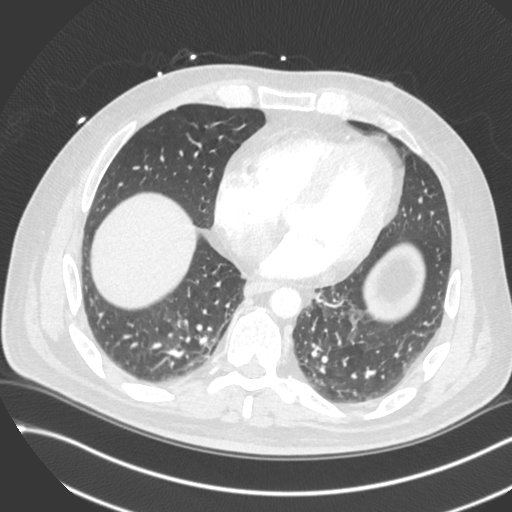
[im 225/449  soft-tissue]
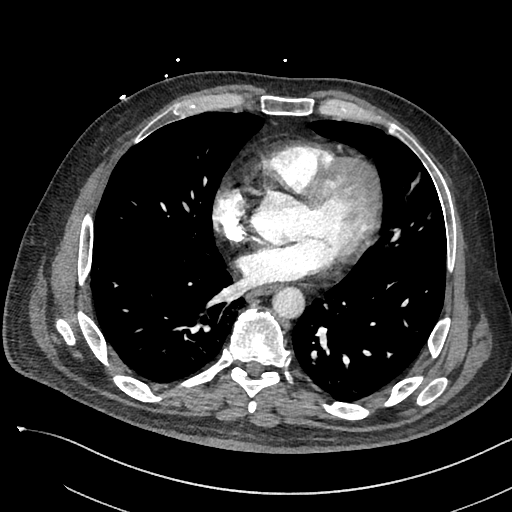
[im 249/449  lung]
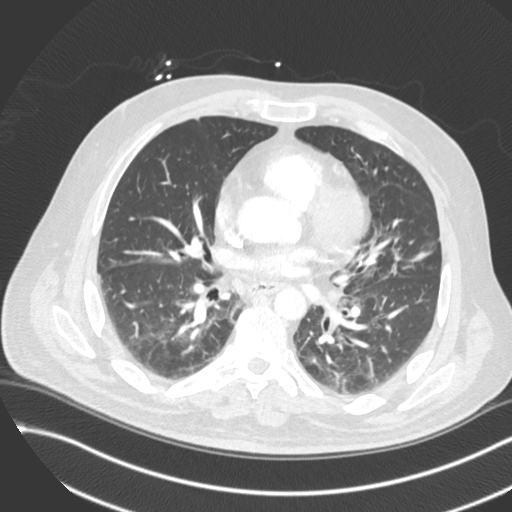
[im 274/449  soft-tissue]
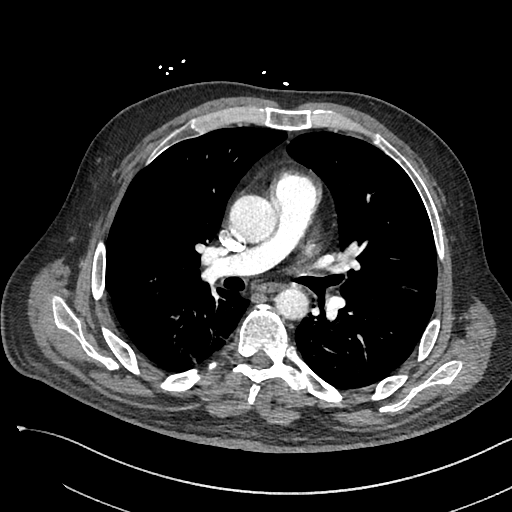
[im 299/449  lung]
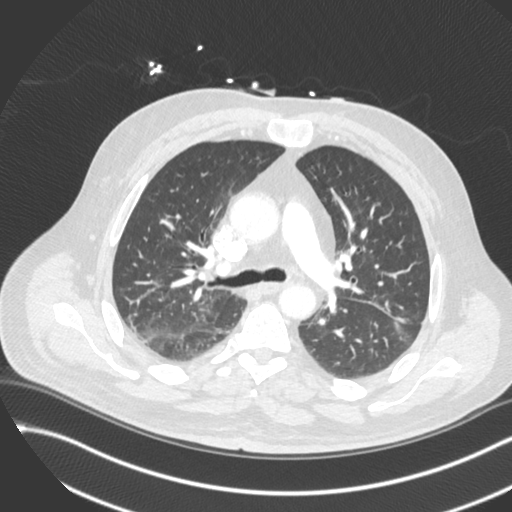
[im 349/449  soft-tissue]
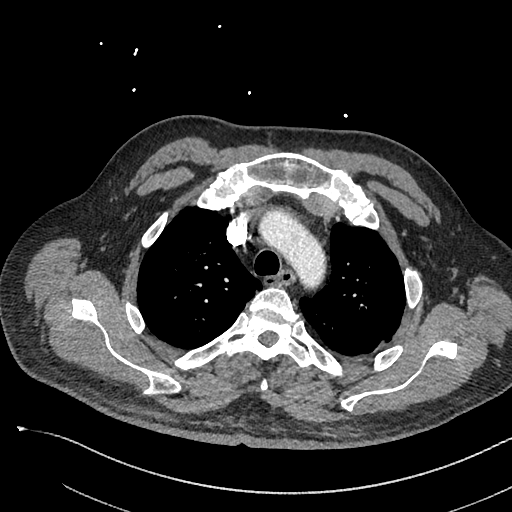
[im 374/449  lung]
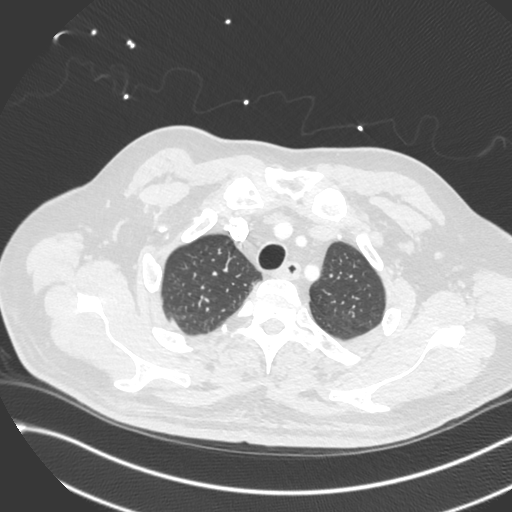
[im 399/449  soft-tissue]
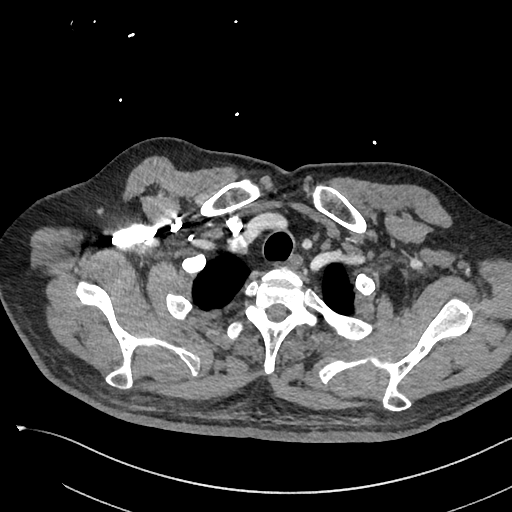
[im 424/449  lung]
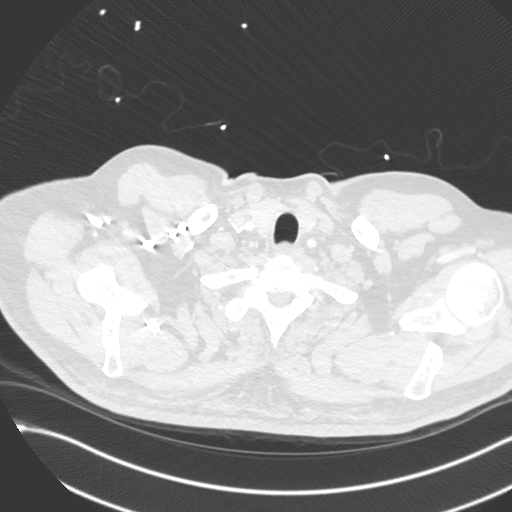

[Series 8: cor · coronal · 0.70mm/px · 3 of 153 slices shown]
[im 39/153  soft-tissue]
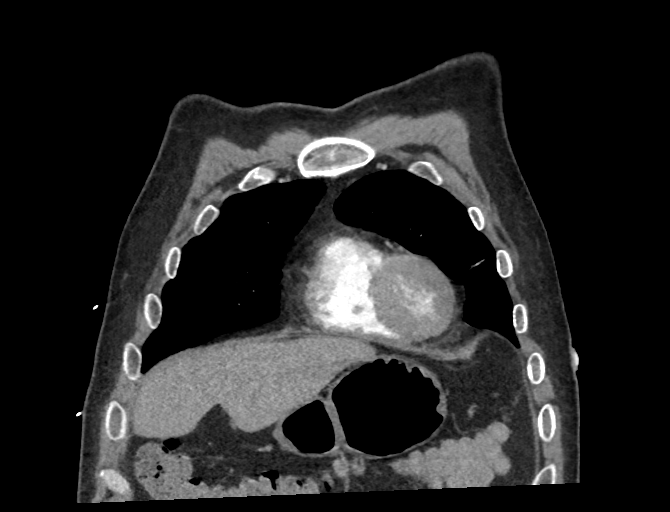
[im 77/153  soft-tissue]
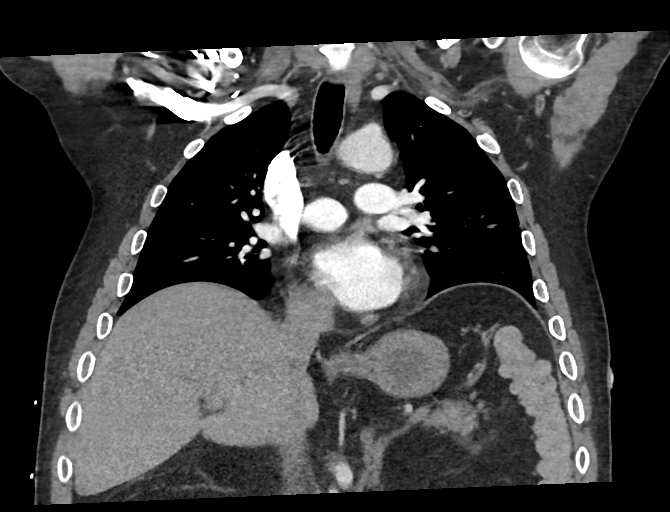
[im 115/153  soft-tissue]
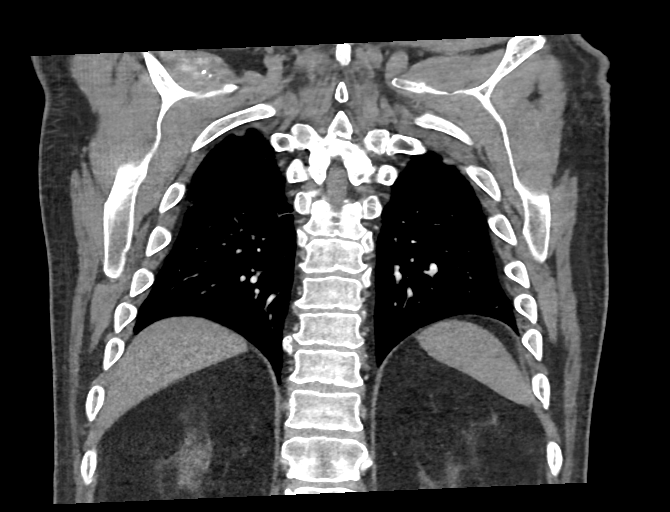

[18 of 46 positions shown; findings below may reference images not displayed]

RADIATION DOSE REDUCTION: This exam was performed according to the
departmental dose-optimization program which includes automated
exposure control, adjustment of the mA and/or kV according to
patient size and/or use of iterative reconstruction technique.

CONTRAST:  58mL OMNIPAQUE IOHEXOL 350 MG/ML SOLN
FINDINGS: Cardiovascular: Satisfactory opacification of the pulmonary arteries
to the segmental level. No evidence of pulmonary embolism. Normal
heart size moderate severity coronary artery calcification. No
pericardial effusion.

Mediastinum/Nodes: No enlarged mediastinal, hilar, or axillary lymph
nodes. Thyroid gland, trachea, and esophagus demonstrate no
significant findings.

Lungs/Pleura: Mild areas of linear scarring and/or atelectasis are
seen within the posterior aspects of the bilateral upper lobes and
bilateral lower lobes

A 2.7 cm diameter, thin walled, chronic versus congenital
parenchymal cyst is seen within the posterior aspect of the right
lower lobe.

There is no evidence of a pleural effusion or pneumothorax.

Upper Abdomen: A 1.1 cm cyst is seen within the anterolateral aspect
of the mid left kidney.

Musculoskeletal: No chest wall abnormality. No acute or significant
osseous findings.

Review of the MIP images confirms the above findings.
IMPRESSION: 1. No evidence of pulmonary embolism.
2. Mild bilateral upper lobe and bilateral lower lobe linear
scarring and/or atelectasis.

## 2021-06-06 MED ORDER — IOHEXOL 350 MG/ML SOLN
75.0000 mL | Freq: Once | INTRAVENOUS | Status: AC | PRN
  Administered 2021-06-06: 75 mL via INTRAVENOUS

## 2021-06-06 MED ORDER — INSULIN REGULAR(HUMAN) IN NACL 100-0.9 UT/100ML-% IV SOLN
INTRAVENOUS | Status: DC
  Administered 2021-06-06: 6 [IU]/h via INTRAVENOUS
  Filled 2021-06-06: qty 100

## 2021-06-06 MED ORDER — DEXTROSE IN LACTATED RINGERS 5 % IV SOLN: INTRAVENOUS | Status: DC

## 2021-06-06 MED ORDER — LACTATED RINGERS IV BOLUS
1000.0000 mL | Freq: Once | INTRAVENOUS | Status: AC
  Administered 2021-06-06: 1000 mL via INTRAVENOUS

## 2021-06-06 MED ORDER — DEXTROSE 50 % IV SOLN: 0.0000 mL | INTRAVENOUS | Status: DC | PRN

## 2021-06-06 MED ORDER — LACTATED RINGERS IV SOLN: INTRAVENOUS | Status: DC

## 2021-06-06 MED ORDER — SODIUM CHLORIDE 0.9 % IV BOLUS
1000.0000 mL | Freq: Once | INTRAVENOUS | Status: AC
  Administered 2021-06-06: 1000 mL via INTRAVENOUS

## 2021-06-06 MED ORDER — ENOXAPARIN SODIUM 40 MG/0.4ML IJ SOSY
40.0000 mg | PREFILLED_SYRINGE | INTRAMUSCULAR | Status: DC
  Administered 2021-06-06 – 2021-06-08 (×3): 40 mg via SUBCUTANEOUS
  Filled 2021-06-06 (×3): qty 0.4

## 2021-06-06 MED ORDER — IOHEXOL 350 MG/ML SOLN
58.0000 mL | Freq: Once | INTRAVENOUS | Status: AC | PRN
  Administered 2021-06-06: 58 mL via INTRAVENOUS

## 2021-06-06 MED ORDER — SODIUM CHLORIDE 0.9% FLUSH: 3.0000 mL | Freq: Once | INTRAVENOUS | Status: DC

## 2021-06-06 MED ORDER — INSULIN REGULAR(HUMAN) IN NACL 100-0.9 UT/100ML-% IV SOLN
INTRAVENOUS | Status: DC
  Administered 2021-06-06: 2.2 [IU]/h via INTRAVENOUS
  Administered 2021-06-07: 1.5 [IU]/h via INTRAVENOUS
  Filled 2021-06-06: qty 100

## 2021-06-06 NOTE — ED Provider Notes (Signed)
?[facility] EMERGENCY DEPARTMENT ?Provider Note ? ? ?CSN: [id number] ?Arrival date & time: 06/06/21  1130 ? ?  ? ?History ? ?Chief Complaint  ?Patient presents with  ? Stroke Symptoms  ? ? ?Frank Cuevas is a 51 y.o. male. ? ?HPI ? ?  ? ?52 year old male comes in with chief complaint of strokelike symptoms. ?Patient was seen in the hospital on 4-2 and was diagnosed with stroke.  Symptoms on presentation included dizziness, left-sided weakness, nausea, slurred speech, trouble swallowing.  CT angiogram negative for LVO, but there was concerns for left vertebral dissection.  Patient received TNK at that time. ? ?He was discharged on 4-4.  This morning when he woke up at 715 he was feeling fine.  Subsequently however, when he woke up at 930 again after dozing off, he tried to swallow his pills and had difficulty in swallowing.  His speech was also slurred.  He called his girlfriend, who was concerned about his symptoms and they decided to come to the ER. ? ?Patient denies any weakness.  He has dizziness, but it is at his baseline.  He indicates that he has been having tachypnea ever since he was discharged and might have cottonmouth sensation because of dry mouth, and difficulty in speaking because of it. ? ?His symptoms lasted for 15 minutes, he feels better now. ? ? ? ?Home Medications ?Prior to Admission medications   ?Medication Sig Start Date End Date Taking? Authorizing Provider  ?aspirin EC 81 MG EC tablet Take 1 tablet (81 mg total) by mouth daily. Swallow whole. 06/03/21  Yes Elmer Picker, NP  ?atorvastatin (LIPITOR) 80 MG tablet Take 1 tablet (80 mg total) by mouth daily. 06/03/21  Yes Elmer Picker, NP  ?clopidogrel (PLAVIX) 75 MG tablet Take 1 tablet (75 mg total) by mouth daily. 06/03/21  Yes Elmer Picker, NP  ?ezetimibe (ZETIA) 10 MG tablet Take 1 tablet (10 mg total) by mouth daily. 06/03/21  Yes Shafer, Ludger Nutting, NP  ?JARDIANCE 25 MG TABS tablet Take 25 mg by mouth daily. 03/31/21  Yes [provider]  ?OVER THE COUNTER MEDICATION Take 2 tablets by mouth in the morning and at bedtime. Relief factor   Yes [provider]  ?pioglitazone (ACTOS) 30 MG tablet Take 30 mg by mouth daily. 05/08/21  Yes [provider]  ?sildenafil (VIAGRA) 100 MG tablet Take 100 mg by mouth daily as needed for erectile dysfunction. 01/13/21  Yes [provider]  ?ondansetron (ZOFRAN) 4 MG tablet Take 1 tablet (4 mg total) by mouth every 8 (eight) hours as needed for nausea or vomiting. ?Patient not taking: Reported on 06/06/2021 06/02/21   Elmer Picker, NP  ?ondansetron (ZOFRAN-ODT) 4 MG disintegrating tablet Take 1 tablet (4 mg total) by mouth every 8 (eight) hours as needed for nausea or vomiting. ?Patient not taking: Reported on 06/06/2021 06/02/21   Elmer Picker, NP  ?   ? ?Allergies    ?Onion   ? ?Review of Systems   ?Review of Systems  ?All other systems reviewed and are negative. ? ?Physical Exam ?Updated Vital Signs ?BP (!) 149/99   Pulse (!) 115   Temp (!) 96.3 ?F (35.7 ?C) (Axillary)   Resp (!) 26   Ht 6\' 2"  (1.88 m)   Wt 98.4 kg   SpO2 95%   BMI 27.86 kg/m?  ?Physical Exam ?Vitals and nursing note reviewed.  ?Constitutional:   ?   Appearance: He is well-developed.  ?HENT:  ?   Head:  Atraumatic.  ?Eyes:  ?   Extraocular Movements: Extraocular movements intact.  ?   Pupils: Pupils are equal, round, and reactive to light.  ?Cardiovascular:  ?   Rate and Rhythm: Normal rate.  ?Pulmonary:  ?   Effort: Pulmonary effort is normal.  ?Musculoskeletal:  ?   Cervical back: Neck supple.  ?Skin: ?   General: Skin is warm.  ?Neurological:  ?   Mental Status: He is alert and oriented to person, place, and time.  ?   Cranial Nerves: No cranial nerve deficit.  ?   Sensory: No sensory deficit.  ?   Motor: No weakness.  ?   Coordination: Coordination normal.  ? ? ?ED Results / Procedures / Treatments   ?Labs ?(all labs ordered are listed, but only abnormal results are displayed) ?Labs Reviewed  ?CBC -  Abnormal; Notable for the following components:  ?    Result Value  ? WBC 10.8 (*)   ? RBC 6.26 (*)   ? Hemoglobin 18.5 (*)   ? HCT 57.6 (*)   ? All other components within normal limits  ?DIFFERENTIAL - Abnormal; Notable for the following components:  ? Neutro Abs 9.0 (*)   ? Abs Immature Granulocytes 0.09 (*)   ? All other components within normal limits  ?COMPREHENSIVE METABOLIC PANEL - Abnormal; Notable for the following components:  ? Sodium 133 (*)   ? Potassium 5.6 (*)   ? CO2 <7 (*)   ? Glucose, Bld 195 (*)   ? BUN 26 (*)   ? Creatinine, Ser 1.37 (*)   ? Alkaline Phosphatase 142 (*)   ? Total Bilirubin 2.4 (*)   ? All other components within normal limits  ?LACTIC ACID, PLASMA - Abnormal; Notable for the following components:  ? Lactic Acid, Venous 2.4 (*)   ? All other components within normal limits  ?LACTIC ACID, PLASMA - Abnormal; Notable for the following components:  ? Lactic Acid, Venous 3.1 (*)   ? All other components within normal limits  ?URINALYSIS, ROUTINE W REFLEX MICROSCOPIC - Abnormal; Notable for the following components:  ? Glucose, UA >=500 (*)   ? Hgb urine dipstick SMALL (*)   ? Ketones, ur >80 (*)   ? Protein, ur 30 (*)   ? All other components within normal limits  ?SALICYLATE LEVEL - Abnormal; Notable for the following components:  ? Salicylate Lvl <7.0 (*)   ? All other components within normal limits  ?CBG MONITORING, ED - Abnormal; Notable for the following components:  ? Glucose-Capillary 215 (*)   ? All other components within normal limits  ?I-STAT CHEM 8, ED - Abnormal; Notable for the following components:  ? Sodium 133 (*)   ? BUN 36 (*)   ? Creatinine, Ser 0.60 (*)   ? Glucose, Bld 185 (*)   ? Calcium, Ion 1.14 (*)   ? TCO2 7 (*)   ? Hemoglobin 19.0 (*)   ? HCT 56.0 (*)   ? All other components within normal limits  ?I-STAT VENOUS BLOOD GAS, ED - Abnormal; Notable for the following components:  ? pH, Ven 6.997 (*)   ? pCO2, Ven 21.9 (*)   ? pO2, Ven 101 (*)   ? Bicarbonate  5.4 (*)   ? TCO2 6 (*)   ? Acid-base deficit 25.0 (*)   ? Sodium 133 (*)   ? Calcium, Ion 1.08 (*)   ? HCT 57.0 (*)   ? Hemoglobin 19.4 (*)   ? All  other components within normal limits  ?CBG MONITORING, ED - Abnormal; Notable for the following components:  ? Glucose-Capillary 187 (*)   ? All other components within normal limits  ?CBG MONITORING, ED - Abnormal; Notable for the following components:  ? Glucose-Capillary 182 (*)   ? All other components within normal limits  ?CBG MONITORING, ED - Abnormal; Notable for the following components:  ? Glucose-Capillary 159 (*)   ? All other components within normal limits  ?PROTIME-INR  ?APTT  ?ETHANOL  ?URINALYSIS, MICROSCOPIC (REFLEX)  ?BASIC METABOLIC PANEL  ?BASIC METABOLIC PANEL  ?BASIC METABOLIC PANEL  ?BETA-HYDROXYBUTYRIC ACID  ?BETA-HYDROXYBUTYRIC ACID  ?BASIC METABOLIC PANEL  ?BLOOD GAS, ARTERIAL  ?I-STAT BETA HCG BLOOD, ED ([facility], [facility], [facility] ONLY)  ?TROPONIN I (HIGH SENSITIVITY)  ? ? ?EKG ?EKG Interpretation ? ?Date/Time:  Saturday June 06 2021 11:55:23 EDT ?Ventricular Rate:  109 ?PR Interval:  178 ?QRS Duration: 89 ?QT Interval:  355 ?QTC Calculation: 478 ?R Axis:   3 ?Text Interpretation: Sinus tachycardia Borderline low voltage, extremity leads Anteroseptal infarct, old Minimal ST elevation, inferior leads No acute changes Confirmed by Derwood KaplanNanavati, Mary-Anne Polizzi [phone]([id number]) on 06/06/2021 12:27:00 PM ? ?Radiology ?CT ANGIO HEAD NECK W WO CM ? ?Result Date: 06/06/2021 ?CLINICAL DATA:  Dizziness beginning this morning EXAM: CT ANGIOGRAPHY HEAD AND NECK TECHNIQUE: Multidetector CT imaging of the head and neck was performed using the standard protocol during bolus administration of intravenous contrast. Multiplanar CT image reconstructions and MIPs were obtained to evaluate the vascular anatomy. Carotid stenosis measurements (when applicable) are obtained utilizing NASCET criteria, using the distal internal carotid diameter as the denominator. RADIATION DOSE REDUCTION: This exam was  performed according to the departmental dose-optimization program which includes automated exposure control, adjustment of the mA and/or kV according to patient size and/or use of iterative reconstruction technique. CONT

## 2021-06-06 NOTE — ED Notes (Signed)
Pt states LKW now 0715 ?

## 2021-06-06 NOTE — Progress Notes (Signed)
?   06/06/21 1945  ?Assess: MEWS Score  ?Temp (!) 97.5 ?F (36.4 ?C)  ?BP (!) 147/98  ?Pulse Rate (!) 111  ?ECG Heart Rate (!) 112  ?Resp 20  ?SpO2 100 %  ?O2 Device Room Air  ?Assess: MEWS Score  ?MEWS Temp 0  ?MEWS Systolic 0  ?MEWS Pulse 2  ?MEWS RR 0  ?MEWS LOC 0  ?MEWS Score 2  ?MEWS Score Color Yellow  ?Assess: if the MEWS score is Yellow or Red  ?Were vital signs taken at a resting state? Yes  ?Focused Assessment No change from prior assessment  ?Early Detection of Sepsis Score *See Row Information* Low  ?MEWS guidelines implemented *See Row Information* Yes  ?Treat  ?Pain Scale 0-10  ?Pain Score 0  ?Take Vital Signs  ?Increase Vital Sign Frequency  Yellow: Q 2hr X 2 then Q 4hr X 2, if remains yellow, continue Q 4hrs  ?Escalate  ?MEWS: Escalate Yellow: discuss with charge nurse/RN and consider discussing with provider and RRT  ?Notify: Charge Nurse/RN  ?Name of Charge Nurse/RN Notified [place], RN  ?Date Charge Nurse/RN Notified 06/06/21  ?Time Charge Nurse/RN Notified 2000  ?Document  ?Patient Outcome Stabilized after interventions  ?Progress note created (see row info) Yes  ? ? ?

## 2021-06-06 NOTE — ED Triage Notes (Addendum)
Pt arrives via [employer] from home. Pt had recent stroke one week ago with with TNK given and four [place] stay in ICU. Pt LKW at 0930. Pt stated that he went to shower and felt new onset of dizziness. Family reported slurred speech and dysphagia.  ? ?# 18 R AC by EMS ?

## 2021-06-06 NOTE — ED Notes (Signed)
RN updated Dr Evie Lacks of pt ABG per resp therapist request, no additional orders at this time ?

## 2021-06-06 NOTE — ED Notes (Signed)
Lab rejected ABG sample, RN updated RT ?

## 2021-06-06 NOTE — H&P (Addendum)
?Date: 06/06/2021     ?     ?     ?Patient Name:  Frank FriendlyJeff Bardsley MRN: [id number]  ?DOB: [birth year] Age / Sex: 51 y.o., male   ?[facility]    ?     ?Medical Service: Internal Medicine Teaching Service    ?     ?Attending Physician: Dr. Dickie LaLau, Grace, MD    ?First Contact: Adron BeneGreylon Wayde Gopaul, MD Pager: Carney CornersGG [phone]  ?Second Contact: Thalia BloodgoodVasili Katsadouros, DO Pager: VK [phone]  ?     ?After Hours (After 5p/  First Contact Pager: [phone]  ?weekends / holidays): Second Contact Pager: [phone]  ? ?SUBJECTIVE  ? ?Chief Complaint: Trouble breathing, slurred speech, difficulty swallowing ? ?History of Present Illness:  ? 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   ? ?When patient left the hospital after recent admission he was feeling close to baseline, endorsed only some mild difficulty with balance.  He reports the [place] following discharge he woke up and noticed that he was breathing significantly faster with rapid heart rate up to 120.  Reports that symptoms persisted throughout the [place], and into today as well.  He does endorse feeling out of breath and reports a chest discomfort which she thinks is related to his rapid breathing.  Chest pain is dull, improves if he holds his breath but does not go away.  Does not radiate anywhere.  He states he has been having to breathe through his mouth as he was unable to catch his breath, and notes feeling out of breath even with CPAP on at night.  He notes that today he had return of slurred speech and difficulty swallowing.  The symptoms slurred speech and difficulty swallowing are similar to prior stroke symptoms.  He denies any fever, nausea or vomiting diarrhea, no dysuria,  however he does report frequent urination which she reports is related to his Jardiance.  He has not eaten very much.  He states that he is feeling thirsty, but reports that he drinks plenty of fluids throughout the [place].  He attempted to reach his PCP but was not able to get in contact with anyone due to the holiday.  Since arriving in the emergency department he states that he has begun to feel better with fluids and insulin.  Noticed that his breathing has improved some slightly as well.  Furthermore his trouble speaking and swallowing it is subjectively better as well.  Patient's mother and girlfriend at bedside also agreed that he appears to better compared to this morning. ? ?ED Course: Evaluation in the emergency department, He was evaluated by neurology given strokelike symptoms.  CTA was obtained and did not show any acute abnormalities compared to prior. Patient was noted to have a pH of 6.997 with ketonuria.  Concern for euglycemic DKA.  Initiated on Endo tool.  MRI was postponed until DKA could be stabilized.  Patient admitted for euglycemic DKA. ? ?Meds:  ?Current Meds  ?Medication Sig  ? aspirin EC 81 MG EC tablet Take 1 tablet (81 mg total) by mouth daily. Swallow whole.  ? atorvastatin (LIPITOR) 80 MG tablet Take 1 tablet (80 mg total) by mouth daily.  ? clopidogrel (PLAVIX) 75 MG tablet Take 1 tablet (75 mg total) by mouth daily.  ?  ezetimibe (ZETIA) 10 MG tablet Take 1 tablet (10 mg total) by mouth daily.  ? JARDIANCE 25 MG TABS tablet Take 25 mg by mouth daily.  ? OVER THE COUNTER MEDICATION Take 2 tablets by mouth in the morning and at bedtime. Relief factor  ? pioglitazone (ACTOS) 30 MG tablet Take 30 mg by mouth daily.  ? sildenafil (VIAGRA) 100 MG tablet Take 100 mg by mouth daily as needed for erectile dysfunction.  ? ? ?PMHx ?CVA ?T2DM ?HTN - no longer on BP medications. He has lost 21 lbs in one week.  ?HLD ? ? ?Past Surgical History:  ?Procedure Laterality Date  ? IR ANGIO INTRA EXTRACRAN  SEL INTERNAL CAROTID BILAT MOD SED  06/02/2021  ? IR ANGIO VERTEBRAL SEL VERTEBRAL BILAT MOD SED  06/02/2021  ? IR US GUIDE VASC ACCESS RIGHT  06/02/2021  ? NO PAST SURGERIES    ? ? ?Social:  ?Lives With: Daughter ?Occupation: Works 2 jobs, Glass blower/designer, Makes race car parts ?Support: Family and girlfriend ?Level of Function: Able to perform ADL's. Working with outpatient PT since CVA ?PCP: [facility], [facility] ?Substances: denies tobacco, denies alcohol,  deneis illicit substance  ? ?Family History:  ?Grandmother with colon cancer ?Parents and brother healthy ? ?Allergies: ?Allergies as of 06/06/2021 - Review Complete 06/06/2021  ?Allergen Reaction Noted  ? Onion Anaphylaxis 08/17/2017  ? ? ?Review of Systems: ?A complete ROS was negative except as per HPI.  ? ?OBJECTIVE:  ? ?Physical Exam: ?Blood pressure (!) 144/80, pulse (!) 109, temperature (!) 96.3 ?F (35.7 ?C), temperature source Axillary, resp. rate 20, height 6\' 2"  (1.88 m), weight 98.4 kg, SpO2 99 %.  ?Constitutional: Well-developed male sitting up in bed talking ?HENT: normocephalic atraumatic, mucous membranes moist ?Eyes: conjunctiva non-erythematous ?Neck: supple ?Cardiovascular: Tachycardic no murmurs rubs or gallop ?Pulmonary/Chest: normal work of breathing on room air, lungs clear to auscultation bilaterally, tachypneic ?Abdominal: soft, non-tender, non-distended ?MSK: normal bulk and tone ?Neurological: alert & oriented x 3, 5/5 strength in bilateral upper and lower extremities, hoarse voice ?Skin: warm and dry ?Psych: Mood and affect appropriate ? ?Labs: ?CBC ?   ?Component Value Date/Time  ? WBC 10.8 (H) 06/06/2021 1145  ? RBC 6.26 (H) 06/06/2021 1145  ? HGB 19.4 (H) 06/06/2021 1323  ? HCT 57.0 (H) 06/06/2021 1323  ? PLT 373 06/06/2021 1145  ? MCV 92.0 06/06/2021 1145  ? MCH 29.6 06/06/2021 1145  ? MCHC 32.1 06/06/2021 1145  ? RDW 12.9 06/06/2021 1145  ? LYMPHSABS 0.9 06/06/2021 1145  ? MONOABS 0.8 06/06/2021 1145  ?  EOSABS 0.0 06/06/2021 1145  ? BASOSABS 0.0 06/06/2021 1145  ?  ? ?CMP  ?   ?Component Value Date/Time  ? NA 133 (L) 06/06/2021 1323  ? K 5.1 06/06/2021 1323  ? CL 109 06/06/2021 1215  ? CO2 <7 (L) 06/06/2021 1145  ? GLUCOSE 185 (H) 06/06/2021 1215  ? BUN 36 (H) 06/06/2021 1215  ? CREATININE 0.60 (L) 06/06/2021 1215  ? CALCIUM 9.1 06/06/2021 1145  ? PROT 7.6 06/06/2021 1145  ? ALBUMIN 4.2 06/06/2021 1145  ? AST 27 06/06/2021 1145  ? ALT 15 06/06/2021 1145  ? ALKPHOS 142 (H) 06/06/2021 1145  ? BILITOT 2.4 (H) 06/06/2021 1145  ? GFRNONAA >60 06/06/2021 1145  ? ? ?Imaging: ?CT ANGIO HEAD NECK W WO CM ? ?Result Date: 06/06/2021 ?CLINICAL DATA:  Dizziness beginning this morning EXAM: CT ANGIOGRAPHY HEAD AND NECK TECHNIQUE: Multidetector CT imaging of the head  and neck was performed using the standard protocol during bolus administration of intravenous contrast. Multiplanar CT image reconstructions and MIPs were obtained to evaluate the vascular anatomy. Carotid stenosis measurements (when applicable) are obtained utilizing NASCET criteria, using the distal internal carotid diameter as the denominator. RADIATION DOSE REDUCTION: This exam was performed according to the departmental dose-optimization program which includes automated exposure control, adjustment of the mA and/or kV according to patient size and/or use of iterative reconstruction technique. CONTRAST:  73mL OMNIPAQUE IOHEXOL 350 MG/ML SOLN COMPARISON:  Head CT from earlier today. CTA of the neck from 6 days ago. FINDINGS: CTA NECK FINDINGS Aortic arch: Normal with 3 vessel arch. Right carotid system: Minimal plaque at the bifurcation. No stenosis, ulceration, or beading Left carotid system: Mild low-density plaque at the distal common carotid and bifurcation. No stenosis, ulceration, or beading. Vertebral arteries: No proximal subclavian stenosis. Dominant right vertebral artery. Unchanged attenuation of the left vertebral artery at the V3 segment. Widely patent  right vertebral artery Skeleton: No acute or aggressive finding Other neck: No acute finding Upper chest: Negative Review of the MIP images confirms the above findings CTA HEAD FINDINGS Anterior circulation

## 2021-06-06 NOTE — ED Notes (Signed)
CT notified of need for room for imaging ?

## 2021-06-07 ENCOUNTER — Encounter (HOSPITAL_COMMUNITY): Payer: Self-pay | Admitting: Internal Medicine

## 2021-06-07 ENCOUNTER — Observation Stay (HOSPITAL_COMMUNITY): Payer: BC Managed Care – PPO

## 2021-06-07 DIAGNOSIS — Z8673 Personal history of transient ischemic attack (TIA), and cerebral infarction without residual deficits: Secondary | ICD-10-CM | POA: Diagnosis not present

## 2021-06-07 DIAGNOSIS — Z791 Long term (current) use of non-steroidal anti-inflammatories (NSAID): Secondary | ICD-10-CM | POA: Diagnosis not present

## 2021-06-07 DIAGNOSIS — Z91018 Allergy to other foods: Secondary | ICD-10-CM | POA: Diagnosis not present

## 2021-06-07 DIAGNOSIS — Z8616 Personal history of COVID-19: Secondary | ICD-10-CM | POA: Diagnosis not present

## 2021-06-07 DIAGNOSIS — E873 Alkalosis: Secondary | ICD-10-CM | POA: Diagnosis present

## 2021-06-07 DIAGNOSIS — N179 Acute kidney failure, unspecified: Secondary | ICD-10-CM | POA: Diagnosis present

## 2021-06-07 DIAGNOSIS — I10 Essential (primary) hypertension: Secondary | ICD-10-CM | POA: Diagnosis present

## 2021-06-07 DIAGNOSIS — T383X5A Adverse effect of insulin and oral hypoglycemic [antidiabetic] drugs, initial encounter: Secondary | ICD-10-CM | POA: Diagnosis present

## 2021-06-07 DIAGNOSIS — Z79899 Other long term (current) drug therapy: Secondary | ICD-10-CM | POA: Diagnosis not present

## 2021-06-07 DIAGNOSIS — E860 Dehydration: Secondary | ICD-10-CM | POA: Diagnosis present

## 2021-06-07 DIAGNOSIS — E1110 Type 2 diabetes mellitus with ketoacidosis without coma: Secondary | ICD-10-CM | POA: Diagnosis present

## 2021-06-07 DIAGNOSIS — Z7982 Long term (current) use of aspirin: Secondary | ICD-10-CM | POA: Diagnosis not present

## 2021-06-07 DIAGNOSIS — Z800 Family history of malignant neoplasm of digestive organs: Secondary | ICD-10-CM | POA: Diagnosis not present

## 2021-06-07 DIAGNOSIS — E785 Hyperlipidemia, unspecified: Secondary | ICD-10-CM | POA: Diagnosis present

## 2021-06-07 DIAGNOSIS — Z7984 Long term (current) use of oral hypoglycemic drugs: Secondary | ICD-10-CM | POA: Diagnosis not present

## 2021-06-07 LAB — GLUCOSE, CAPILLARY
Glucose-Capillary: 128 mg/dL — ABNORMAL HIGH (ref 70–99)
Glucose-Capillary: 134 mg/dL — ABNORMAL HIGH (ref 70–99)
Glucose-Capillary: 134 mg/dL — ABNORMAL HIGH (ref 70–99)
Glucose-Capillary: 136 mg/dL — ABNORMAL HIGH (ref 70–99)
Glucose-Capillary: 141 mg/dL — ABNORMAL HIGH (ref 70–99)
Glucose-Capillary: 142 mg/dL — ABNORMAL HIGH (ref 70–99)
Glucose-Capillary: 142 mg/dL — ABNORMAL HIGH (ref 70–99)
Glucose-Capillary: 149 mg/dL — ABNORMAL HIGH (ref 70–99)
Glucose-Capillary: 157 mg/dL — ABNORMAL HIGH (ref 70–99)
Glucose-Capillary: 158 mg/dL — ABNORMAL HIGH (ref 70–99)
Glucose-Capillary: 159 mg/dL — ABNORMAL HIGH (ref 70–99)
Glucose-Capillary: 160 mg/dL — ABNORMAL HIGH (ref 70–99)
Glucose-Capillary: 174 mg/dL — ABNORMAL HIGH (ref 70–99)
Glucose-Capillary: 175 mg/dL — ABNORMAL HIGH (ref 70–99)
Glucose-Capillary: 178 mg/dL — ABNORMAL HIGH (ref 70–99)

## 2021-06-07 LAB — BASIC METABOLIC PANEL
Anion gap: 16 — ABNORMAL HIGH (ref 5–15)
Anion gap: 17 — ABNORMAL HIGH (ref 5–15)
Anion gap: 16 — ABNORMAL HIGH (ref 5–15)
Anion gap: 14 (ref 5–15)
Anion gap: 15 (ref 5–15)
BUN: 19 mg/dL (ref 6–20)
BUN: 19 mg/dL (ref 6–20)
BUN: 16 mg/dL (ref 6–20)
BUN: 14 mg/dL (ref 6–20)
BUN: 12 mg/dL (ref 6–20)
CO2: 9 mmol/L — ABNORMAL LOW (ref 22–32)
CO2: 9 mmol/L — ABNORMAL LOW (ref 22–32)
CO2: 10 mmol/L — ABNORMAL LOW (ref 22–32)
CO2: 11 mmol/L — ABNORMAL LOW (ref 22–32)
CO2: 10 mmol/L — ABNORMAL LOW (ref 22–32)
Calcium: 8.6 mg/dL — ABNORMAL LOW (ref 8.9–10.3)
Calcium: 8.5 mg/dL — ABNORMAL LOW (ref 8.9–10.3)
Calcium: 8.4 mg/dL — ABNORMAL LOW (ref 8.9–10.3)
Calcium: 8.2 mg/dL — ABNORMAL LOW (ref 8.9–10.3)
Calcium: 8.3 mg/dL — ABNORMAL LOW (ref 8.9–10.3)
Chloride: 110 mmol/L (ref 98–111)
Chloride: 109 mmol/L (ref 98–111)
Chloride: 106 mmol/L (ref 98–111)
Chloride: 106 mmol/L (ref 98–111)
Chloride: 107 mmol/L (ref 98–111)
Creatinine, Ser: 1.03 mg/dL (ref 0.61–1.24)
Creatinine, Ser: 1.09 mg/dL (ref 0.61–1.24)
Creatinine, Ser: 1.03 mg/dL (ref 0.61–1.24)
Creatinine, Ser: 0.99 mg/dL (ref 0.61–1.24)
Creatinine, Ser: 0.95 mg/dL (ref 0.61–1.24)
GFR, Estimated: >60 mL/min (ref >60)
GFR, Estimated: >60 mL/min (ref >60)
GFR, Estimated: >60 mL/min (ref >60)
GFR, Estimated: >60 mL/min (ref >60)
GFR, Estimated: >60 mL/min (ref >60)
Glucose, Bld: 146 mg/dL — ABNORMAL HIGH (ref 70–99)
Glucose, Bld: 145 mg/dL — ABNORMAL HIGH (ref 70–99)
Glucose, Bld: 174 mg/dL — ABNORMAL HIGH (ref 70–99)
Glucose, Bld: 170 mg/dL — ABNORMAL HIGH (ref 70–99)
Glucose, Bld: 168 mg/dL — ABNORMAL HIGH (ref 70–99)
Potassium: 4.1 mmol/L (ref 3.5–5.1)
Potassium: 4.1 mmol/L (ref 3.5–5.1)
Potassium: 3.5 mmol/L (ref 3.5–5.1)
Potassium: 3.5 mmol/L (ref 3.5–5.1)
Potassium: 3.4 mmol/L — ABNORMAL LOW (ref 3.5–5.1)
Sodium: 135 mmol/L (ref 135–145)
Sodium: 135 mmol/L (ref 135–145)
Sodium: 132 mmol/L — ABNORMAL LOW (ref 135–145)
Sodium: 131 mmol/L — ABNORMAL LOW (ref 135–145)
Sodium: 132 mmol/L — ABNORMAL LOW (ref 135–145)

## 2021-06-07 LAB — CBC
HCT: 50.8 % (ref 39.0–52.0)
Hemoglobin: 17.1 g/dL — ABNORMAL HIGH (ref 13.0–17.0)
MCH: 29.1 pg (ref 26.0–34.0)
MCHC: 33.7 g/dL (ref 30.0–36.0)
MCV: 86.4 fL (ref 80.0–100.0)
Platelets: 326 10*3/uL (ref 150–400)
RBC: 5.88 MIL/uL — ABNORMAL HIGH (ref 4.22–5.81)
RDW: 13.1 % (ref 11.5–15.5)
WBC: 10.8 10*3/uL — ABNORMAL HIGH (ref 4.0–10.5)
nRBC: 0 % (ref 0.0–0.2)

## 2021-06-07 LAB — BETA-HYDROXYBUTYRIC ACID: Beta-Hydroxybutyric Acid: 5.12 mmol/L — ABNORMAL HIGH (ref 0.05–0.27)

## 2021-06-07 LAB — LACTIC ACID, PLASMA: Lactic Acid, Venous: 1.4 mmol/L (ref 0.5–1.9)

## 2021-06-07 IMAGING — MR MR HEAD W/O CM
12 of 13 series · 44 of 48 positions shown · non-contrast
Comparison: CT studies [DATE]

CLINICAL DATA: Neuro deficit, acute, stroke suspected.

EXAM:
MRI HEAD WITHOUT CONTRAST
TECHNIQUE: Multiplanar, multiecho pulse sequences of the brain and surrounding
structures were obtained without intravenous contrast.

[Series 5: DWI · axial · 3.0mm · 0.88mm/px · z∈[-64,+94]mm · 7 of 108 slices shown (1 of 4)]
[im 1/108]
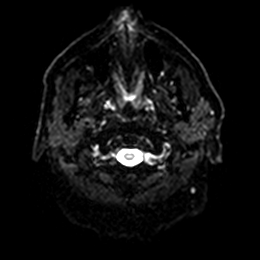
[im 18/108]
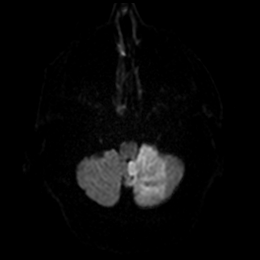
[im 36/108]
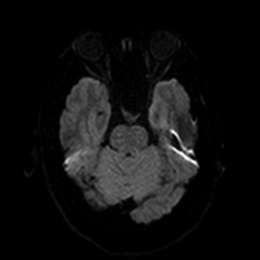
[im 54/108]
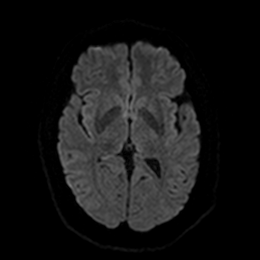
[im 72/108]
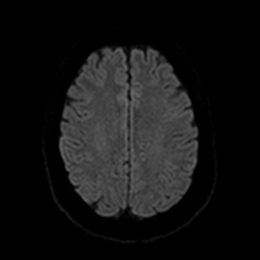
[im 90/108]
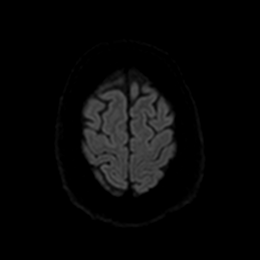
[im 108/108]
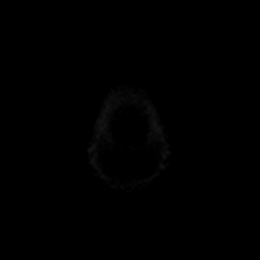

[Series 6: DWI · axial · 3.0mm · 0.88mm/px · z∈[-64,+94]mm · 4 of 53 slices shown (2 of 4)]
[im 1/53]
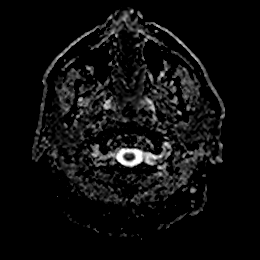
[im 18/53]
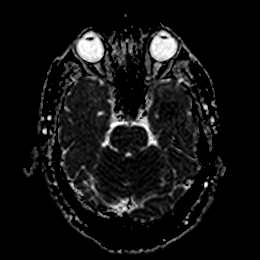
[im 35/53]
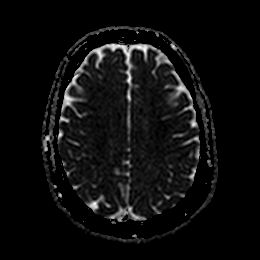
[im 53/53]
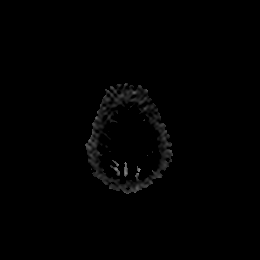

[Series 7: DWI · coronal · 4.0mm · 0.88mm/px · 6 of 76 slices shown (3 of 4)]
[im 1/76]
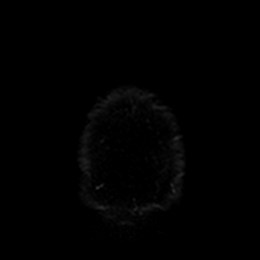
[im 16/76]
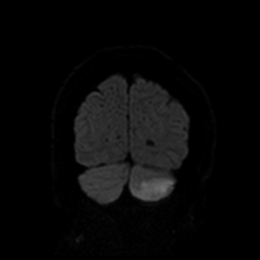
[im 31/76]
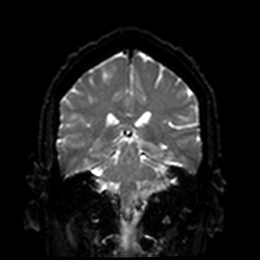
[im 46/76]
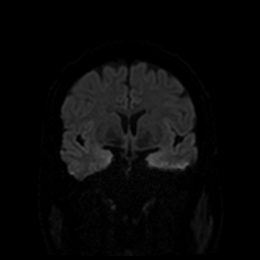
[im 61/76]
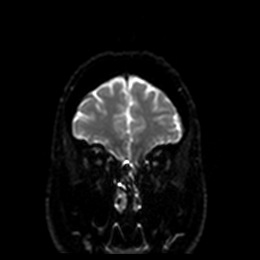
[im 76/76]
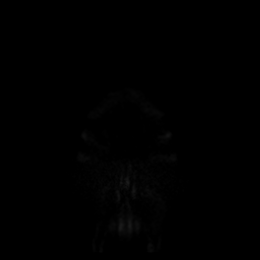

[Series 8: DWI · coronal · 4.0mm · 0.88mm/px · 3 of 38 slices shown (4 of 4)]
[im 1/38]
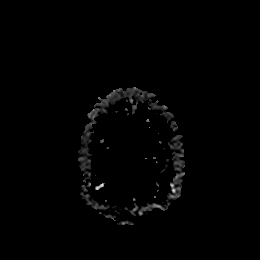
[im 19/38]
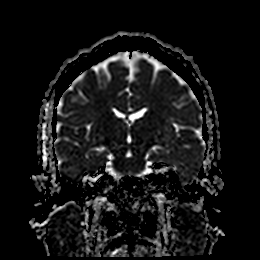
[im 38/38]
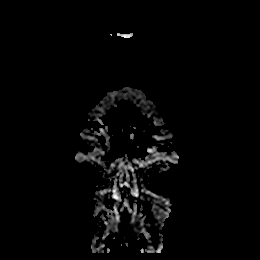

[Series 9: T1 · sagittal · 5.0mm · 0.75mm/px · 2 of 23 slices shown]
[im 1/23]
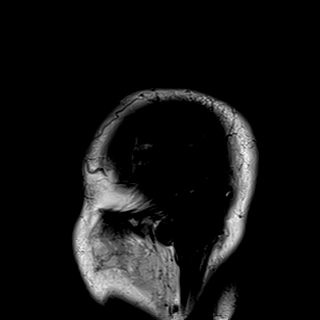
[im 23/23]
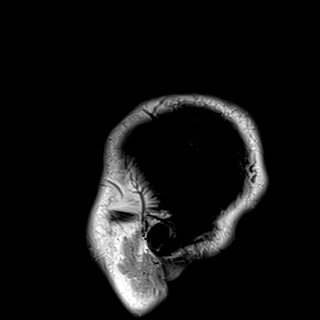

[Series 10: T2 · axial · 5.0mm · 0.72mm/px · z∈[-63,+92]mm · 2 of 27 slices shown (1 of 2)]
[im 1/27]
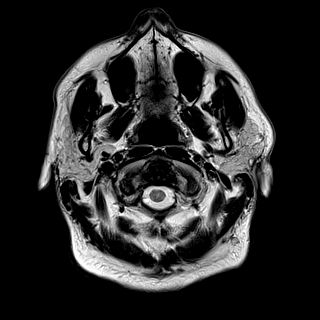
[im 27/27]
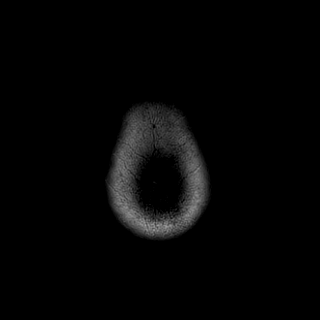

[Series 11: FLAIR · axial · 5.0mm · 0.45mm/px · z∈[-62,+93]mm · 2 of 27 slices shown]
[im 1/27]
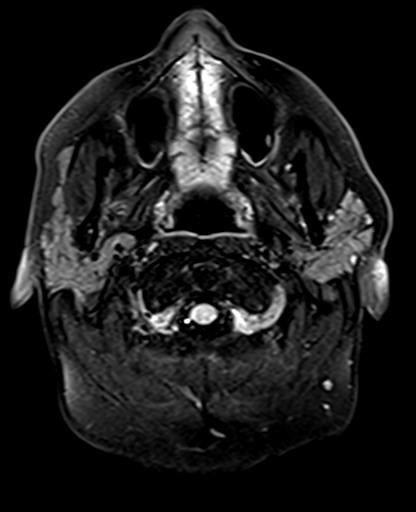
[im 27/27]
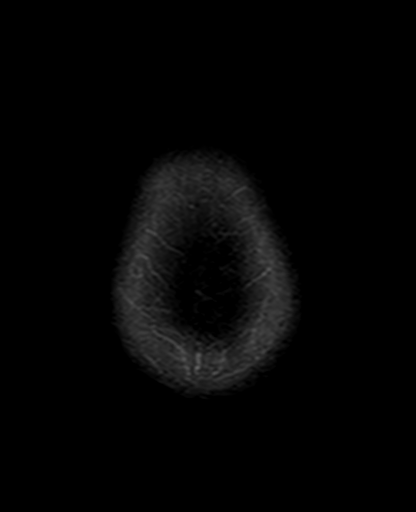

[Series 12: mag_images · axial · 3.0mm · 0.90mm/px · z∈[-66,+98]mm · 4 of 56 slices shown]
[im 1/56]
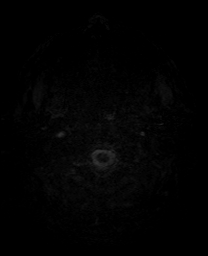
[im 19/56]
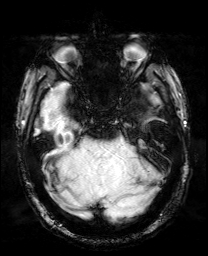
[im 37/56]
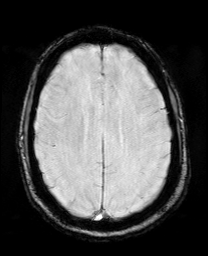
[im 56/56]
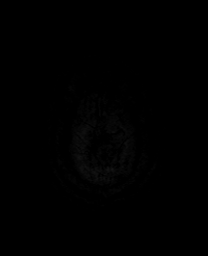

[Series 13: pha_images · axial · 3.0mm · 0.90mm/px · z∈[-63,+95]mm · 4 of 54 slices shown]
[im 1/54]
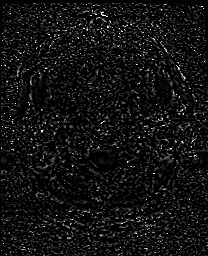
[im 18/54]
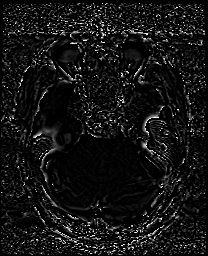
[im 36/54]
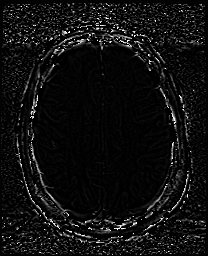
[im 54/54]
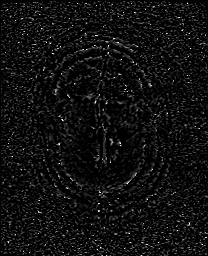

[Series 14: swi_images · axial · 3.0mm · 0.90mm/px · z∈[-66,+98]mm · 4 of 56 slices shown]
[im 1/56]
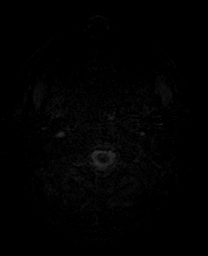
[im 19/56]
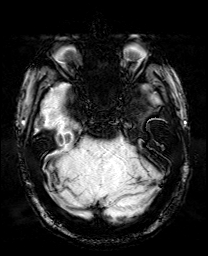
[im 37/56]
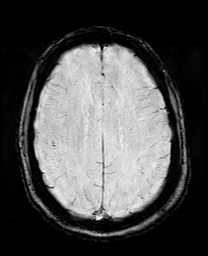
[im 56/56]
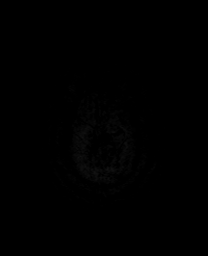

[Series 15: mip_images(sw) · axial · 24.0mm · 0.90mm/px · z∈[-56,+87]mm · 4 of 49 slices shown]
[im 1/49]
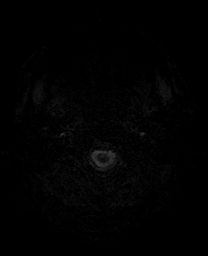
[im 17/49]
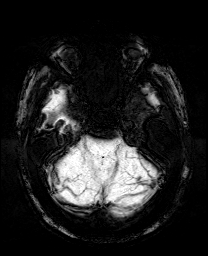
[im 33/49]
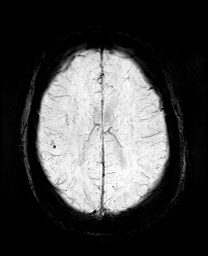
[im 49/49]
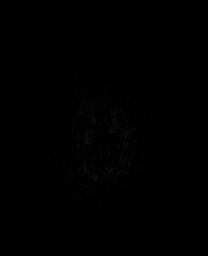

[Series 17: T2 · coronal · 5.0mm · 0.34mm/px · 2 of 32 slices shown (2 of 2)]
[im 1/32]
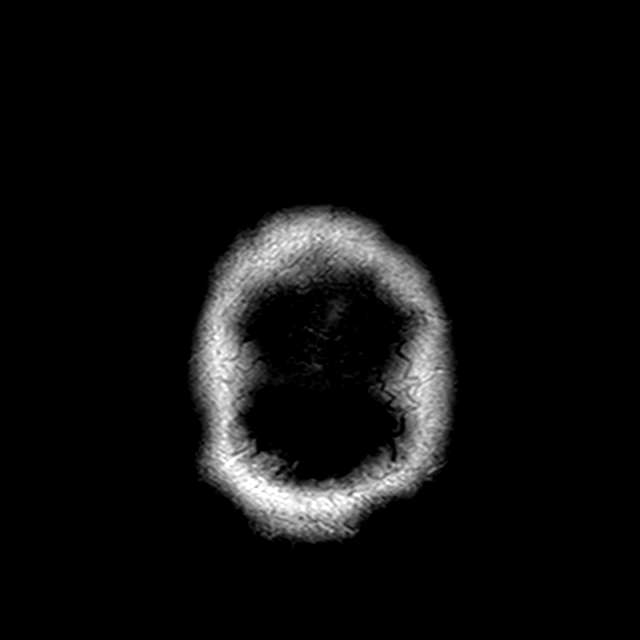
[im 32/32]
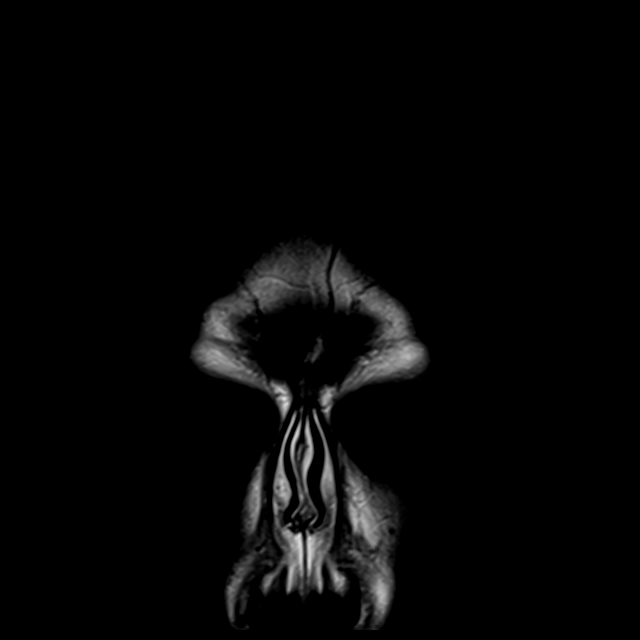

[44 of 48 positions shown; findings below may reference images not displayed]

FINDINGS: Brain: Acute/subacute infarction of the inferior cerebellum on the
left consistent with PICA territory infarction. No involvement of
the medulla. Mild swelling with petechial blood products but no
frank hematoma. No compromise of the fourth ventricle.

Cerebral hemispheres are normal without old or recent small or large
vessel stroke, mass, hydrocephalus or extra-axial collection.

Vascular: Apparent abnormal flow left vertebral artery. See results
of previous CT angiogram.

Skull and upper cervical spine: Negative

Sinuses/Orbits: Clear/normal

Other: None
IMPRESSION: Acute infarction of the left inferior cerebellum in the PICA
distribution. Petechial blood products without frank hematoma. Mild
swelling but no compromise of the fourth ventricle.

Abnormal flow visible in the left vertebral artery which could be
slow flow or occlusion by MRI. See results of CT angiography from
yesterday.

## 2021-06-07 MED ORDER — POTASSIUM CHLORIDE 10 MEQ/100ML IV SOLN
10.0000 meq | INTRAVENOUS | Status: DC
  Administered 2021-06-08 (×2): 10 meq via INTRAVENOUS
  Filled 2021-06-07: qty 100

## 2021-06-07 MED ORDER — PANTOPRAZOLE SODIUM 40 MG PO TBEC
40.0000 mg | DELAYED_RELEASE_TABLET | Freq: Every day | ORAL | Status: DC | PRN
  Administered 2021-06-07: 40 mg via ORAL
  Filled 2021-06-07: qty 1

## 2021-06-07 MED ORDER — EZETIMIBE 10 MG PO TABS
10.0000 mg | ORAL_TABLET | Freq: Every day | ORAL | Status: DC
  Administered 2021-06-07 – 2021-06-09 (×3): 10 mg via ORAL
  Filled 2021-06-07 (×3): qty 1

## 2021-06-07 MED ORDER — ATORVASTATIN CALCIUM 80 MG PO TABS
80.0000 mg | ORAL_TABLET | Freq: Every day | ORAL | Status: DC
  Administered 2021-06-07 – 2021-06-09 (×3): 80 mg via ORAL
  Filled 2021-06-07 (×3): qty 1

## 2021-06-07 MED ORDER — KCL-LACTATED RINGERS-D5W 20 MEQ/L IV SOLN
INTRAVENOUS | Status: DC
  Filled 2021-06-07 (×5): qty 1000

## 2021-06-07 MED ORDER — POTASSIUM PHOSPHATES 15 MMOLE/5ML IV SOLN
30.0000 mmol | Freq: Once | INTRAVENOUS | Status: AC
  Administered 2021-06-07: 30 mmol via INTRAVENOUS
  Filled 2021-06-07: qty 10

## 2021-06-07 MED ORDER — CLOPIDOGREL BISULFATE 75 MG PO TABS
75.0000 mg | ORAL_TABLET | Freq: Every day | ORAL | Status: DC
  Administered 2021-06-07 – 2021-06-09 (×3): 75 mg via ORAL
  Filled 2021-06-07 (×3): qty 1

## 2021-06-07 MED ORDER — ASPIRIN EC 81 MG PO TBEC
81.0000 mg | DELAYED_RELEASE_TABLET | Freq: Every day | ORAL | Status: DC
  Administered 2021-06-07 – 2021-06-09 (×3): 81 mg via ORAL
  Filled 2021-06-07 (×3): qty 1

## 2021-06-07 MED ORDER — POTASSIUM CHLORIDE 20 MEQ PO PACK: 40.0000 meq | PACK | Freq: Once | ORAL | Status: DC

## 2021-06-07 NOTE — Evaluation (Signed)
Occupational Therapy Evaluation ?Patient Details ?Name: Frank Cuevas ?MRN: [id number] ?DOB: 1970-05-24 ?Today's Date: 06/07/2021 ? ? ?History of Present Illness Patient is a 51 year old male presents with complaints of heavy breathing, tachycardia, slurred speech trouble swallowing. Diagnosed with DKA, dehydration. 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 ?Clinical Impression ?  ?Patient with recent discharge after CVA. Reports since being home has been using rolling walker for ambulation. Patient states he has an outpatient PT appointment scheduled for this coming Wednesday and would like to still be able to go. Patient needed min A for bed mobility as well as safety with ambulation to/from bathroom due to mild unsteadiness and at time narrow base of support. Patient did demonstrate fair static standing balance at sink while completing grooming/hygiene. Will continue to follow acutely, do not anticipate OT needs at D/C.   ?   ? ?Recommendations for follow up therapy are one component of a multi-disciplinary discharge planning process, led by the attending physician.  Recommendations may be updated based on patient status, additional functional criteria and insurance authorization.  ? ?Follow Up Recommendations ? Other (comment) (patient has appointment for OP PT wednesday)  ?  ?Assistance Recommended at Discharge Intermittent Supervision/Assistance  ?Patient can return home with the following A little help with walking and/or transfers;A little help with bathing/dressing/bathroom;Assist for transportation ? ?  ?Functional Status Assessment ? Patient has had a recent decline in their functional status and demonstrates the ability to make significant improvements in function in a reasonable and predictable amount of time.  ?Equipment Recommendations ? None recommended by OT  ?  ?   ?Precautions / Restrictions Precautions ?Precautions: Fall  ? ?  ? ?Mobility Bed  Mobility ?Overal bed mobility: Needs Assistance ?Bed Mobility: Supine to Sit, Sit to Supine ?  ?  ?Supine to sit: Min assist, HOB elevated ?Sit to supine: Min guard ?  ?General bed mobility comments: Patient needed min A to upright trunk to sitting position ?  ? ? ? ?  ?Balance Overall balance assessment: Needs assistance ?Sitting-balance support: Feet supported ?Sitting balance-Leahy Scale: Good ?  ?  ?Standing balance support: During functional activity, No upper extremity supported ?Standing balance-Leahy Scale: Fair ?  ?  ?  ?  ?  ?  ?  ?  ?  ?  ?  ?  ?   ? ?ADL either performed or assessed with clinical judgement  ? ?ADL Overall ADL's : Needs assistance/impaired ?  ?  ?Grooming: Oral care;Wash/dry face;Wash/dry hands;Min guard;Standing ?  ?Upper Body Bathing: Set up;Sitting ?  ?Lower Body Bathing: Min guard;Sit to/from stand ?  ?Upper Body Dressing : Set up;Sitting ?  ?Lower Body Dressing: Min guard;Sit to/from stand ?  ?Toilet Transfer: Ambulation;Rolling walker (2 wheels);Minimal assistance ?Toilet Transfer Details (indicate cue type and reason): Patient is mildly unsteady with ambulation to/from bathroom although no overt losses of balance. At times note almost near scissoring/narror base of support. ?Toileting- Architect and Hygiene: Min guard;Sit to/from stand ?  ?  ?  ?Functional mobility during ADLs: Minimal assistance;Rolling walker (2 wheels) ?General ADL Comments: Patient denies any dizziness this session, reports feeling weak as he has not eaten in 3 days. HR 131 with activity.  ? ? ? ?Vision Baseline Vision/History: 1 Wears glasses ?   ?   ?   ?   ? ?Pertinent Vitals/Pain Pain Assessment ?Pain Assessment: No/denies pain  ? ? ? ?Hand Dominance Right ?  ?Extremity/Trunk Assessment  Upper Extremity Assessment ?Upper Extremity Assessment: RUE deficits/detail;LUE deficits/detail ?RUE Deficits / Details: Bilateral shoulder AROM limited ~100 degrees scaption otherwise MMT and ROM WFL. Did not  assess R elbow strength due to placement of IV/patient request. ?  ?  ?  ?Cervical / Trunk Assessment ?Cervical / Trunk Assessment: Normal ?  ?Communication Communication ?Communication: No difficulties ?  ?Cognition Arousal/Alertness: Awake/alert ?Behavior During Therapy: [facility] for tasks assessed/performed ?Overall Cognitive Status: Within Functional Limits for tasks assessed ?  ?  ?  ?  ?  ?  ?  ?  ?  ?  ?  ?  ?  ?  ?  ?  ?  ?  ?  ?   ?   ?   ? ? ?Home Living Family/patient expects to be discharged to:: Private residence ?Living Arrangements: Children ?Available Help at Discharge: Available 24 hours/[place] ?Type of Home: House ?Home Access: Stairs to enter ?Entrance Stairs-Number of Steps: 1 ?Entrance Stairs-Rails: Right ?Home Layout: One level ?  ?  ?Bathroom Shower/Tub: Walk-in shower ?  ?Bathroom Toilet: Handicapped height ?  ?  ?Home Equipment: None ?  ?Additional Comments: s.o. and family can work out 24/7 assist ?  ? ?  ?Prior Functioning/Environment Prior Level of Function : Independent/Modified Independent ?  ?  ?  ?  ?  ?  ?Mobility Comments: indep, no AD ?ADLs Comments: works 2 jobs, indep in all IADLs ?  ? ?  ?  ?OT Problem List: Decreased activity tolerance;Impaired balance (sitting and/or standing);Decreased safety awareness;Decreased knowledge of use of DME or AE ?  ?   ?OT Treatment/Interventions: Self-care/ADL training;Balance training;Patient/family education;Therapeutic activities;DME and/or AE instruction  ?  ?OT Goals(Current goals can be found in the care plan section) Acute Rehab OT Goals ?Patient Stated Goal: Feel better ?OT Goal Formulation: With patient ?Time For Goal Achievement: 06/21/21 ?Potential to Achieve Goals: Good  ?OT Frequency: Min 2X/week ?  ? ?   ?AM-PAC OT "6 Clicks" Daily Activity     ?Outcome Measure Help from another person eating meals?: None ?Help from another person taking care of personal grooming?: A Little ?Help from another person toileting, which includes using toliet,  bedpan, or urinal?: A Little ?Help from another person bathing (including washing, rinsing, drying)?: A Little ?Help from another person to put on and taking off regular upper body clothing?: A Little ?Help from another person to put on and taking off regular lower body clothing?: A Little ?6 Click Score: 19 ?  ?End of Session Equipment Utilized During Treatment: Rolling walker (2 wheels) ?Nurse Communication: Mobility status ? ?Activity Tolerance: Patient tolerated treatment well ?Patient left: in bed;with call bell/phone within reach;with family/visitor present ? ?OT Visit Diagnosis: Unsteadiness on feet (R26.81)  ?              ?Time: 2778-2423 ?OT Time Calculation (min): 21 min ?Charges:  OT General Charges ?$OT Visit: 1 Visit ?OT Evaluation ?$OT Eval Low Complexity: 1 Low ? ?Marlyce Huge OT ?OT pager: [phone] ? ? ?Carmelia Roller ?06/07/2021, 11:29 AM ?

## 2021-06-07 NOTE — Progress Notes (Addendum)
? ? 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   ? ?Overnight Events: Continued on Endo-tool ? ?  ?Interm History:  ?Reports he is feeling improved. Breathing more comfortably. Has not gotten out of bed. Does report heart burn worse when lying flat.  ? ?OBJECTIVE:  ?Vital Signs: ?Vitals:  ? 06/06/21 1945 06/06/21 2349 06/07/21 0321 06/07/21 0740  ?BP: (!) 147/98 (!) 149/89 (!) 143/89 (!) 163/93  ?Pulse: (!) 111 (!) 116 (!) 119 (!) 122  ?Resp: 20 19 17 18   ?Temp: (!) 97.5 ?F (36.4 ?C) 97.7 ?F (36.5 ?C) 97.8 ?F (36.6 ?C) 97.7 ?F (36.5 ?C)  ?TempSrc: Oral Oral  Oral  ?SpO2: 100% 100% 100% 100%  ?Weight: 102 kg     ?Height: 6\' 2"  (1.88 m)     ? ?Supplemental O2:  ?SpO2: 100 % ? ?Filed Weights  ? 06/06/21 1140 06/06/21 1945  ?Weight: 98.4 kg 102 kg  ? ? ? ?Intake/Output Summary (Last 24 hours) at 06/07/2021 1422 ?Last data filed at 06/07/2021 0402 ?Gross per 24 hour  ?Intake 2733.26 ml  ?Output 1600 ml  ?Net 1133.26 ml  ? ?Net IO Since Admission: 458.26 mL [06/07/21 1422] ? ?Physical Exam: ?Constitutional: Well-developed male sitting up in bed talking ?HENT: normocephalic atraumatic, mucous membranes moist ?Eyes: conjunctiva non-erythematous ?Neck: supple ?Cardiovascular: Tachycardic no murmurs rubs or gallop ?Pulmonary/Chest: normal work of breathing on room air, lungs clear to auscultation bilaterally ?Abdominal: soft, non-tender, non-distended ?MSK: normal bulk and tone ?Neurological: alert & oriented x 3, 5/5 strength in bilateral upper and lower extremities, hoarse voice ?Skin: warm and dry ?Psych: Mood and affect appropriate ? ?Patient Lines/Drains/Airways Status   ? ? Active Line/Drains/Airways   ? ?  Name Placement date Placement time Site Days  ? Peripheral IV 06/06/21 18 G Right Antecubital 06/06/21  1137  Antecubital  1  ? Peripheral IV 06/06/21 20 G 1" Anterior;Left Forearm 06/06/21  1414  Forearm  1  ? ?  ?  ? ?  ? ? ?Pertinent Labs: ? ?  Latest Ref Rng & Units 06/07/2021  ?  2:57 AM 06/06/2021  ?  1:23 PM 06/06/2021  ? 12:15 PM  ?CBC  ?WBC 4.0 - 10.5 K/uL 10.8      ?Hemoglobin 13.0 - 17.0 g/dL 08/06/2021   08/06/2021   99.3    ?Hematocrit 39.0 - 52.0 % 50.8   57.0   56.0    ?Platelets 150 - 400 K/uL 326      ? ? ? ?  Latest Ref Rng & Units 06/07/2021  ?  7:02 AM 06/07/2021  ?  2:57 AM 06/06/2021  ? 11:23 PM  ?CMP  ?Glucose 70 - 99 mg/dL 08/07/2021   08/06/2021   893    ?BUN 6 - 20 mg/dL 19   19   22     ?Creatinine 0.61 - 1.24 mg/dL 810   175      ?Sodium 135 - 145 mmol/L 135   135   135    ?Potassium 3.5 - 5.1 mmol/L 4.1   4.1   4.4    ?Chloride 98 - 111 mmol/L 109   110   110    ?CO2 22 - 32 mmol/L 9   9   8     ?  Calcium 8.9 - 10.3 mg/dL 8.5   8.6   8.8    ? ? ?Recent Labs  ?  06/07/21 ?0956 06/07/21 ?1204 06/07/21 ?1406  ?GLUCAP 157* 158* 175*  ?  ? ?Pertinent Imaging: ?CT Angio Chest Pulmonary Embolism (PE) W or WO Contrast ? ?Result Date: 06/06/2021 ?CLINICAL DATA:  Elevated D-dimer. EXAM: CT ANGIOGRAPHY CHEST WITH CONTRAST TECHNIQUE: Multidetector CT imaging of the chest was performed using the standard protocol during bolus administration of intravenous contrast. Multiplanar CT image reconstructions and MIPs were obtained to evaluate the vascular anatomy. RADIATION DOSE REDUCTION: This exam was performed according to the departmental dose-optimization program which includes automated exposure control, adjustment of the mA and/or kV according to patient size and/or use of iterative reconstruction technique. CONTRAST:  58mL OMNIPAQUE IOHEXOL 350 MG/ML SOLN COMPARISON:  None. FINDINGS: Cardiovascular: Satisfactory opacification of the pulmonary arteries to the segmental level. No evidence of pulmonary embolism. Normal heart size  moderate severity coronary artery calcification. No pericardial effusion. Mediastinum/Nodes: No enlarged mediastinal, hilar, or axillary lymph nodes. Thyroid gland, trachea, and esophagus demonstrate no significant findings. Lungs/Pleura: Mild areas of linear scarring and/or atelectasis are seen within the posterior aspects of the bilateral upper lobes and bilateral lower lobes A 2.7 cm diameter, thin walled, chronic versus congenital parenchymal cyst is seen within the posterior aspect of the right lower lobe. There is no evidence of a pleural effusion or pneumothorax. Upper Abdomen: A 1.1 cm cyst is seen within the anterolateral aspect of the mid left kidney. Musculoskeletal: No chest wall abnormality. No acute or significant osseous findings. Review of the MIP images confirms the above findings. IMPRESSION: 1. No evidence of pulmonary embolism. 2. Mild bilateral upper lobe and bilateral lower lobe linear scarring and/or atelectasis. Electronically Signed   By: Aram Candelahaddeus  Houston M.D.   On: 06/06/2021 21:20  ? ?MR BRAIN WO CONTRAST ? ?Result Date: 06/07/2021 ?CLINICAL DATA:  Neuro deficit, acute, stroke suspected. EXAM: MRI HEAD WITHOUT CONTRAST TECHNIQUE: Multiplanar, multiecho pulse sequences of the brain and surrounding structures were obtained without intravenous contrast. COMPARISON:  CT studies 06/06/2021 FINDINGS: Brain: Acute/subacute infarction of the inferior cerebellum on the left consistent with PICA territory infarction. No involvement of the medulla. Mild swelling with petechial blood products but no frank hematoma. No compromise of the fourth ventricle. Cerebral hemispheres are normal without old or recent small or large vessel stroke, mass, hydrocephalus or extra-axial collection. Vascular: Apparent abnormal flow left vertebral artery. See results of previous CT angiogram. Skull and upper cervical spine: Negative Sinuses/Orbits: Clear/normal Other: None IMPRESSION: Acute infarction of the left  inferior cerebellum in the PICA distribution. Petechial blood products without frank hematoma. Mild swelling but no compromise of the fourth ventricle. Abnormal flow visible in the left vertebral artery which could be slow flow or occlusion by MRI. See results of CT angiography from yesterday. Electronically Signed   By: Paulina FusiMark  Shogry M.D.   On: 06/07/2021 11:53  ? ?DG CHEST PORT 1 VIEW ? ?Result Date: 06/06/2021 ?CLINICAL DATA:  Shortness of breath and dyspnea EXAM: PORTABLE CHEST 1 VIEW COMPARISON:  None. FINDINGS: The cardiomediastinal silhouette is the upper limits of normal in contour. No pleural effusion. No pneumothorax. Band like opacity in the LEFT mid lung. Visualized abdomen is unremarkable. IMPRESSION: Band like opacity in the LEFT mid lung, likely atelectasis. Electronically Signed   By: Meda KlinefelterStephanie  Peacock M.D.   On: 06/06/2021 17:06   ? ?ASSESSMENT/PLAN:  ?Assessment: ?Principal Problem: ?  DKA (diabetic ketoacidosis) (HCC) ? ? ?51-year-old  male history of hypertension recent stroke presented with strokelike symptoms suspected to be recrudescent of prior stroke in the setting of DKA. ? ?Euglycemic DKA-severe DKA ?Diabetes type 2 poorly controlled, most recent A1c 9.5 ?Dehydration ?Severe anion gap metabolic acidosis ?- Given patient's acidosis and anion gap, he is in severe DKA.  Glucose mildly elevated.  He does have ketonuria and significant glucosuria, however he is on Jardiance.  ?- PE was ruled out as precipitating factor. Likely 2/2 Jardiance. ?- Improving pH 7.1 ?- Bicarb 9 today.   ?- Started on Endo tool in the ED. ?- Fluids, glucose under 250 will need D5 and LR.  Potassium 4.1, given ongoing insulin Will add 20 mEq potassium to his D5 LR. ?- Hypophosphatemia being repleted with potassium phosphate ?- BMP every 4. Gap 16 this AM. Continue endo-tool for now.  Can transition to long acting when gap closed X2. ?- Delta delta less than 1, secondary non-anion gap metabolic acidosis likely due to  respiratory alkalosis.  Winters formula predicting PCO2 compensation for bicarb of 9 to be 20 to 24%. ?  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

## 2021-06-07 NOTE — Evaluation (Signed)
Physical Therapy Evaluation ?Patient Details ?Name: Frank Cuevas ?MRN: [id number] ?DOB: 1970/06/10 ?Today's Date: 06/07/2021 ? ?History of Present Illness ? Patient is a 51 year old male who presents 06/06/2021 with complaints of heavy breathing, tachycardia, slurred speech trouble swallowing. Diagnosed with DKA, dehydration. 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 Petechial blood products without frank hematoma. Mild  swelling but no compromise of the fourth ventricle. PMH: hypertension and hyperlipidemia recent admission for stroke with posterior circulation dissection  ?Clinical Impression ? Pt admitted with above. Pt reports using RW for mobility since discharge; prior to recent stroke, pt was independent and works 2 jobs. Pt demonstrates impairments in static/standing balance, functional strength and gait. Pt ambulating 60 ft with a walker at a min guard assist level. HR 119-154 bpm. Has good support at home. Would benefit from OPPT at discharge to address deficits.    ?   ? ?Recommendations for follow up therapy are one component of a multi-disciplinary discharge planning process, led by the attending physician.  Recommendations may be updated based on patient status, additional functional criteria and insurance authorization. ? ?Follow Up Recommendations Outpatient PT (set up in [place]) ? ?  ?Assistance Recommended at Discharge PRN  ?Patient can return home with the following ? Assist for transportation;Help with stairs or ramp for entrance;Direct supervision/assist for financial management;Direct supervision/assist for medications management;Assistance with cooking/housework;A little help with walking and/or transfers;A little help with bathing/dressing/bathroom ? ?  ?Equipment Recommendations None recommended by PT  ?Recommendations for Other Services ?    ?  ?Functional Status Assessment Patient has had a recent decline in their functional status and demonstrates the ability  to make significant improvements in function in a reasonable and predictable amount of time.  ? ?  ?Precautions / Restrictions Precautions ?Precautions: Fall;Other (comment) ?Precaution Comments: watch HR ?Restrictions ?Weight Bearing Restrictions: No  ? ?  ? ?Mobility ? Bed Mobility ?Overal bed mobility: Needs Assistance ?Bed Mobility: Supine to Sit, Sit to Supine ?  ?  ?Supine to sit: Min assist, HOB elevated ?Sit to supine: Min guard ?  ?General bed mobility comments: Patient needed min A to upright trunk to sitting position ?  ? ?Transfers ?Overall transfer level: Needs assistance ?Equipment used: Rolling walker (2 wheels), None ?Transfers: Sit to/from Stand ?Sit to Stand: Supervision ?  ?  ?  ?  ?  ?  ?  ? ?Ambulation/Gait ?Ambulation/Gait assistance: Min guard ?Gait Distance (Feet): 60 Feet ?Assistive device: Rolling walker (2 wheels) ?Gait Pattern/deviations: Step-through pattern, Decreased stride length ?Gait velocity: decreased ?  ?  ?General Gait Details: Tendency for downward gaze, slow and steady gait ? ?Stairs ?  ?  ?  ?  ?  ? ?Wheelchair Mobility ?  ? ?Modified Rankin (Stroke Patients Only) ?Modified Rankin (Stroke Patients Only) ?Pre-Morbid Rankin Score: No symptoms ?Modified Rankin: Moderately severe disability ? ?  ? ?Balance Overall balance assessment: Needs assistance ?Sitting-balance support: Feet supported ?Sitting balance-Leahy Scale: Good ?  ?  ?Standing balance support: During functional activity, No upper extremity supported ?Standing balance-Leahy Scale: Fair ?  ?Single Leg Stance - Right Leg: 4 ?Single Leg Stance - Left Leg: 0 ?Tandem Stance - Right Leg: 10 ?  ?Rhomberg - Eyes Opened: 10 ?Rhomberg - Eyes Closed: 10 ?  ?  ?  ?  ?  ?   ? ? ? ?Pertinent Vitals/Pain Pain Assessment ?Pain Assessment: No/denies pain  ? ? ?Home Living Family/patient expects to be discharged to:: Private residence ?  Living Arrangements: Children ?Available Help at Discharge: Available 24 hours/[facility] ?Type of Home:  House ?Home Access: Stairs to enter ?Entrance Stairs-Rails: Right ?Entrance Stairs-Number of Steps: 1 ?  ?Home Layout: One level ?Home Equipment: None ?Additional Comments: s.o. and family can work out 24/7 assist  ?  ?Prior Function Prior Level of Function : Independent/Modified Independent ?  ?  ?  ?  ?  ?  ?Mobility Comments: indep, no AD ?ADLs Comments: works 2 jobs, indep in all IADLs ?  ? ? ?Hand Dominance  ? Dominant Hand: Right ? ?  ?Extremity/Trunk Assessment  ? Upper Extremity Assessment ?Upper Extremity Assessment: Defer to OT evaluation ?RUE Deficits / Details: Bilateral shoulder AROM limited ~100 degrees scaption otherwise MMT and ROM WFL. Did not assess R elbow strength due to placement of IV/patient request. ?  ? ?Lower Extremity Assessment ?Lower Extremity Assessment: RLE deficits/detail;LLE deficits/detail ?RLE Deficits / Details: Strength 5/5 ?LLE Deficits / Details: Strength 5/5 ?  ? ?Cervical / Trunk Assessment ?Cervical / Trunk Assessment: Normal  ?Communication  ? Communication: No difficulties  ?Cognition Arousal/Alertness: Awake/alert ?Behavior During Therapy: [facility] for tasks assessed/performed ?Overall Cognitive Status: Within Functional Limits for tasks assessed ?  ?  ?  ?  ?  ?  ?  ?  ?  ?  ?  ?  ?  ?  ?  ?  ?  ?  ?  ? ?  ?General Comments   ? ?  ?Exercises    ? ?Assessment/Plan  ?  ?PT Assessment Patient needs continued PT services  ?PT Problem List Decreased mobility;Decreased activity tolerance;Decreased balance;Impaired sensation ? ?   ?  ?PT Treatment Interventions DME instruction;Therapeutic activities;Gait training;Therapeutic exercise;Patient/family education;Balance training;Stair training;Functional mobility training   ? ?PT Goals (Current goals can be found in the Care Plan section)  ?Acute Rehab PT Goals ?Patient Stated Goal: to return to independent ?PT Goal Formulation: With patient ?Time For Goal Achievement: 06/21/21 ?Potential to Achieve Goals: Good ? ?  ?Frequency Min  3X/week ?  ? ? ?Co-evaluation   ?  ?  ?  ?  ? ? ?  ?AM-PAC PT "6 Clicks" Mobility  ?Outcome Measure Help needed turning from your back to your side while in a flat bed without using bedrails?: None ?Help needed moving from lying on your back to sitting on the side of a flat bed without using bedrails?: A Little ?Help needed moving to and from a bed to a chair (including a wheelchair)?: A Little ?Help needed standing up from a chair using your arms (e.g., wheelchair or bedside chair)?: A Little ?Help needed to walk in hospital room?: A Little ?Help needed climbing 3-5 steps with a railing? : A Little ?6 Click Score: 19 ? ?  ?End of Session   ?Activity Tolerance: Patient tolerated treatment well ?Patient left: in bed;with call bell/phone within reach;with family/visitor present ?Nurse Communication: Mobility status ?PT Visit Diagnosis: Other abnormalities of gait and mobility (R26.89);Other symptoms and signs involving the nervous system (R29.898);Dizziness and giddiness (R42) ?  ? ?Time: 1950-9326 ?PT Time Calculation (min) (ACUTE ONLY): 19 min ? ? ?Charges:   PT Evaluation ?$PT Eval Low Complexity: 1 Low ?  ?  ?   ? ? ?Lillia Pauls, PT, DPT ?Acute Rehabilitation Services ?Pager [phone] ?Office [phone] ? ? ?Norval Morton ?06/07/2021, 2:10 PM ?

## 2021-06-07 NOTE — Progress Notes (Signed)
Inpatient Diabetes Program Recommendations ? ?AACE/ADA: New Consensus Statement on Inpatient Glycemic Control (2015) ? ?Target Ranges:  Prepandial:   less than 140 mg/dL ?     Peak postprandial:   less than 180 mg/dL (1-2 hours) ?     Critically ill patients:  140 - 180 mg/dL  ? ?Lab Results  ?Component Value Date  ? GLUCAP 157 (H) 06/07/2021  ? HGBA1C 9.5 (H) 05/31/2021  ? ? ?Review of Glycemic Control ? Latest Reference Range & Units 06/07/21 05:17 06/07/21 06:26 06/07/21 07:33 06/07/21 09:56  ?Glucose-Capillary 70 - 99 mg/dL 774 (H) 128 (H) 786 (H) 157 (H)  ?(H): Data is abnormally high ? ?Diabetes history: DM2 ?Outpatient Diabetes medications: Jardiance 25 mg qd, Actos 30 mg QD ?Current orders for Inpatient glycemic control: IV insulin ? ?Inpatient Diabetes Program Recommendations:   ? ?Euglycemic DKA.  Please consider increasing dextrose infusion to D10 or allow patient to eat to help clear acidosis. ? ?Considering history of CVA and current A1C might consider discharging on insulin.  Should not take Jardiance moving forward.  Attempted to call patient room and cell; no answer.  Our team spoke with him last week during previous admission about A1C. ? ?Will continue to follow while inpatient. ? ?Thank you, ?Dulce Sellar, MSN, RN ?Diabetes Coordinator ?Inpatient Diabetes Program ?[phone] (team pager from 8a-5p) ? ? ? ?

## 2021-06-07 NOTE — Plan of Care (Signed)
  Problem: Education: Goal: Knowledge of General Education information will improve Description: Including pain rating scale, medication(s)/side effects and non-pharmacologic comfort measures Outcome: Progressing   Problem: Health Behavior/Discharge Planning: Goal: Ability to manage health-related needs will improve Outcome: Progressing   Problem: Clinical Measurements: Goal: Ability to maintain clinical measurements within normal limits will improve Outcome: Progressing Goal: Will remain free from infection Outcome: Progressing Goal: Diagnostic test results will improve Outcome: Progressing Goal: Respiratory complications will improve Outcome: Progressing Goal: Cardiovascular complication will be avoided Outcome: Progressing   Problem: Activity: Goal: Risk for activity intolerance will decrease Outcome: Progressing   Problem: Elimination: Goal: Will not experience complications related to urinary retention Outcome: Progressing   Problem: Pain Managment: Goal: General experience of comfort will improve Outcome: Progressing   Problem: Safety: Goal: Ability to remain free from injury will improve Outcome: Progressing   Problem: Skin Integrity: Goal: Risk for impaired skin integrity will decrease Outcome: Progressing   

## 2021-06-07 NOTE — Progress Notes (Signed)
Messaged on-call neurologist to discuss MRI findings for this patient. Neurology reported no formal consult to neurology was placed in ED during initial evaluation and they had not been following.  Attending team was unaware this was the case. ?Discussed findings with neurology and it was felt that given location of stroke in cerebellum, likely same stroke as previously treated for. Also noted improvement in blood flow on repeat CTA. No changes to current management were recommended. Continue to treat with ASA, Plavix, statin, and monitor for changes.  ?

## 2021-06-08 LAB — BASIC METABOLIC PANEL
Anion gap: 9 (ref 5–15)
Anion gap: 10 (ref 5–15)
Anion gap: 10 (ref 5–15)
Anion gap: 11 (ref 5–15)
BUN: 11 mg/dL (ref 6–20)
BUN: 10 mg/dL (ref 6–20)
BUN: 9 mg/dL (ref 6–20)
BUN: 8 mg/dL (ref 6–20)
CO2: 14 mmol/L — ABNORMAL LOW (ref 22–32)
CO2: 17 mmol/L — ABNORMAL LOW (ref 22–32)
CO2: 17 mmol/L — ABNORMAL LOW (ref 22–32)
CO2: 19 mmol/L — ABNORMAL LOW (ref 22–32)
Calcium: 8 mg/dL — ABNORMAL LOW (ref 8.9–10.3)
Calcium: 8.2 mg/dL — ABNORMAL LOW (ref 8.9–10.3)
Calcium: 7.9 mg/dL — ABNORMAL LOW (ref 8.9–10.3)
Calcium: 8 mg/dL — ABNORMAL LOW (ref 8.9–10.3)
Chloride: 111 mmol/L (ref 98–111)
Chloride: 109 mmol/L (ref 98–111)
Chloride: 107 mmol/L (ref 98–111)
Chloride: 105 mmol/L (ref 98–111)
Creatinine, Ser: 0.87 mg/dL (ref 0.61–1.24)
Creatinine, Ser: 0.78 mg/dL (ref 0.61–1.24)
Creatinine, Ser: 0.78 mg/dL (ref 0.61–1.24)
Creatinine, Ser: 0.63 mg/dL (ref 0.61–1.24)
GFR, Estimated: >60 mL/min (ref >60)
GFR, Estimated: >60 mL/min (ref >60)
GFR, Estimated: >60 mL/min (ref >60)
GFR, Estimated: >60 mL/min (ref >60)
Glucose, Bld: 161 mg/dL — ABNORMAL HIGH (ref 70–99)
Glucose, Bld: 151 mg/dL — ABNORMAL HIGH (ref 70–99)
Glucose, Bld: 163 mg/dL — ABNORMAL HIGH (ref 70–99)
Glucose, Bld: 164 mg/dL — ABNORMAL HIGH (ref 70–99)
Potassium: 3.4 mmol/L — ABNORMAL LOW (ref 3.5–5.1)
Potassium: 3.6 mmol/L (ref 3.5–5.1)
Potassium: 3.3 mmol/L — ABNORMAL LOW (ref 3.5–5.1)
Potassium: 3.2 mmol/L — ABNORMAL LOW (ref 3.5–5.1)
Sodium: 134 mmol/L — ABNORMAL LOW (ref 135–145)
Sodium: 136 mmol/L (ref 135–145)
Sodium: 134 mmol/L — ABNORMAL LOW (ref 135–145)
Sodium: 135 mmol/L (ref 135–145)

## 2021-06-08 LAB — GLUCOSE, CAPILLARY
Glucose-Capillary: 127 mg/dL — ABNORMAL HIGH (ref 70–99)
Glucose-Capillary: 128 mg/dL — ABNORMAL HIGH (ref 70–99)
Glucose-Capillary: 139 mg/dL — ABNORMAL HIGH (ref 70–99)
Glucose-Capillary: 144 mg/dL — ABNORMAL HIGH (ref 70–99)
Glucose-Capillary: 148 mg/dL — ABNORMAL HIGH (ref 70–99)
Glucose-Capillary: 151 mg/dL — ABNORMAL HIGH (ref 70–99)
Glucose-Capillary: 152 mg/dL — ABNORMAL HIGH (ref 70–99)
Glucose-Capillary: 159 mg/dL — ABNORMAL HIGH (ref 70–99)
Glucose-Capillary: 161 mg/dL — ABNORMAL HIGH (ref 70–99)
Glucose-Capillary: 163 mg/dL — ABNORMAL HIGH (ref 70–99)
Glucose-Capillary: 188 mg/dL — ABNORMAL HIGH (ref 70–99)

## 2021-06-08 LAB — MAGNESIUM: Magnesium: 2.1 mg/dL (ref 1.7–2.4)

## 2021-06-08 LAB — PHOSPHORUS
Phosphorus: <1 mg/dL — CL (ref 2.5–4.6)
Phosphorus: 1.5 mg/dL — ABNORMAL LOW (ref 2.5–4.6)
Phosphorus: 1.9 mg/dL — ABNORMAL LOW (ref 2.5–4.6)

## 2021-06-08 MED ORDER — POTASSIUM CHLORIDE 2 MEQ/ML IV SOLN: INTRAVENOUS | Status: DC

## 2021-06-08 MED ORDER — POTASSIUM PHOSPHATES 15 MMOLE/5ML IV SOLN
30.0000 mmol | Freq: Once | INTRAVENOUS | Status: DC
  Administered 2021-06-08: 30 mmol via INTRAVENOUS
  Filled 2021-06-08: qty 10

## 2021-06-08 MED ORDER — POTASSIUM PHOSPHATES 15 MMOLE/5ML IV SOLN
30.0000 mmol | Freq: Once | INTRAVENOUS | Status: AC
  Administered 2021-06-08: 30 mmol via INTRAVENOUS
  Filled 2021-06-08: qty 10

## 2021-06-08 MED ORDER — POTASSIUM CHLORIDE 10 MEQ/100ML IV SOLN
INTRAVENOUS | Status: AC
  Filled 2021-06-08: qty 100

## 2021-06-08 MED ORDER — POTASSIUM CHLORIDE 10 MEQ/100ML IV SOLN
10.0000 meq | INTRAVENOUS | Status: AC
  Administered 2021-06-08 (×2): 10 meq via INTRAVENOUS
  Filled 2021-06-08 (×2): qty 100

## 2021-06-08 MED ORDER — POTASSIUM CHLORIDE 2 MEQ/ML IV SOLN
INTRAVENOUS | Status: DC
  Filled 2021-06-08: qty 1000

## 2021-06-08 MED ORDER — INSULIN GLARGINE-YFGN 100 UNIT/ML ~~LOC~~ SOLN
10.0000 [IU] | SUBCUTANEOUS | Status: DC
  Administered 2021-06-08 – 2021-06-09 (×2): 10 [IU] via SUBCUTANEOUS
  Filled 2021-06-08 (×2): qty 0.1

## 2021-06-08 MED ORDER — KCL-LACTATED RINGERS-D5W 20 MEQ/L IV SOLN
INTRAVENOUS | Status: DC
Start: 2021-06-08 — End: 2021-06-08
  Filled 2021-06-08 (×2): qty 1000

## 2021-06-08 MED ORDER — POTASSIUM CHLORIDE CRYS ER 20 MEQ PO TBCR
20.0000 meq | EXTENDED_RELEASE_TABLET | Freq: Once | ORAL | Status: AC
  Administered 2021-06-08: 20 meq via ORAL
  Filled 2021-06-08: qty 1

## 2021-06-08 MED ORDER — INSULIN ASPART 100 UNIT/ML IJ SOLN
0.0000 [IU] | Freq: Three times a day (TID) | INTRAMUSCULAR | Status: DC
  Administered 2021-06-08: 2 [IU] via SUBCUTANEOUS
  Administered 2021-06-08: 1 [IU] via SUBCUTANEOUS

## 2021-06-08 MED ORDER — K PHOS MONO-SOD PHOS DI & MONO 155-852-130 MG PO TABS
250.0000 mg | ORAL_TABLET | Freq: Once | ORAL | Status: AC
  Administered 2021-06-08: 250 mg via ORAL
  Filled 2021-06-08: qty 1

## 2021-06-08 NOTE — Discharge Instructions (Addendum)
Dear Mr. Lasecki, ? ?Thank you for trusting [place] with your care.  ?We treated you for euglycemic DKA. We think your Jardiance likely caused this.  ?We have stopped your Jardiance. It is an SGLT2 inhibitor. Please avoid this class of medications in the future.  ?Please continue taking the Actos as you have been. We are also starting you on long acting insulin. Please inject 10 units daily. If you notice signs of low blood sugar, please make sure you have a snack on hand.  ?Please ensure you follow up with your primary care doctor within 1 week. They will need to recheck your electrolytes. If you have difficulty following up with your PCP, you may schedule an appointment to be seen in our clinic by calling [phone].  ?Please also ensure that you follow up with your neurologist.  ?Lastly, you are scheduled for an appointment with the physical therapists tomorrow. ? ? ?Local Endocrinologists ? Endocrinology [phone]) ?Dr. Carlus Pavlov ?Dr. Reather Littler ?Abby Shamleffer ?Dr. Romero Belling ?[facility] [phone]) ?Dr. Talmage Coin ?[facility] [phone]) ?Dr. Dorisann Frames ?Dr. Carmon Sails Averneni ?[facility] [phone]) ?Dr. Adrian Prince ?[facility] [phone]) [[place] office]  [phone]) [[place] office] ?Dr. Wendall Mola ?Dr. Verdis Frederickson ?Dr. Quillian Quince [facility]) [phone]) [phone]  [address] [phone]) ?[facility]) [phone]) ?Autumn Hudnall Yetta Barre), PA ?Dr. Izell Marengo ?Dr. Jillyn Ledger. ?Dr. Ocie Cornfield (private practice) [facility] [phone] [address]) ([phone]) ?Dr. Kyung Rudd (private practice) ([address]) [phone]) ?[facility]) [phone] [place]) [phone]) ?Dr. Hassell Done ?Fredia Sorrow, NP ?Margarette Canada, PA ?Dr. Casimiro Needle [place] ?Dr. Kizzie Fantasia ?[facility]  [phone]) ?Dr. Marquis Lunch ?Ronny Bacon, FNP ?

## 2021-06-08 NOTE — Progress Notes (Signed)
Inpatient Diabetes Program Recommendations ? ?AACE/ADA: New Consensus Statement on Inpatient Glycemic Control (2015) ? ?Target Ranges:  Prepandial:   less than 140 mg/dL ?     Peak postprandial:   less than 180 mg/dL (1-2 hours) ?     Critically ill patients:  140 - 180 mg/dL  ? ?Lab Results  ?Component Value Date  ? GLUCAP 148 (H) 06/08/2021  ? HGBA1C 9.5 (H) 05/31/2021  ? ? ?Review of Glycemic Control ? Latest Reference Range & Units 06/07/21 05:17 06/07/21 06:26 06/07/21 07:33 06/07/21 09:56  ?Glucose-Capillary 70 - 99 mg/dL 142 (H) 149 (H) 142 (H) 157 (H)  ?(H): Data is abnormally high ? ?Diabetes history: DM2 ?Outpatient Diabetes medications: Jardiance 25 mg qd, Actos 30 mg QD ?Current orders for Inpatient glycemic control:  ?Semglee 10 units ?Novolog 0-9 units tid ? ?A1c 9.5% on 4/2   ? ?Inpatient Diabetes Program Recommendations:   ? ?Spoke with pt at bedside regarding A1c and glucose control outpt. Pt recently obtained a new PCP that he has not seen yet. Pt reports never being placed on a glucometer for monitoring outpt. 5 years ago A1c was 14%, he reduced it by loosing 100 pounds and was placed on Jardiance and Actos and reduced A1c to 7%. Discussed glucose and A1c goals. Pt had questions about DKA. Pt also had mom at bedside which also had questions about diet and exercise. Pt had questions about the keto diet. Showed how to operate the insulin pen and discussed trouble shooting. Considering history of CVA and current A1C might consider discharging on insulin.  ? ?At time of d/c pt will need: ?Basal insulin ?Insulin pen needles order # [id number] ?Glucose meter kit [id number] ?Home Actos should be ok to resume ? ?Will continue to follow while inpatient. ? ?Thanks, ?Tama Headings RN, MSN, BC-ADM ?Inpatient Diabetes Coordinator ?Team Pager [phone] (8a-5p) ? ? ?

## 2021-06-08 NOTE — Plan of Care (Signed)

## 2021-06-08 NOTE — Progress Notes (Addendum)
? ? 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   ? ?Overnight Events: GAP closed ? ?  ?Interm History:  ?Patient feeling better.  Questions regarding future insulin needs answered.  Discussed DKA with the patient.  States breathing and heart rate are feeling better.  Friend present at bedside. ? ?OBJECTIVE:  ?Vital Signs: ?Vitals:  ? 06/08/21 0400 06/08/21 0800 06/08/21 1200 06/08/21 1600  ?BP:      ?Pulse:      ?Resp:      ?Temp: 97.6 ?F (36.4 ?C) (!) 97.5 ?F (36.4 ?C) 98 ?F (36.7 ?C) 98.1 ?F (36.7 ?C)  ?TempSrc: Oral Oral Oral Oral  ?SpO2:      ?Weight:      ?Height:      ? ?Supplemental O2:  ?SpO2: 94 % ? ?Filed Weights  ? 06/06/21 1140 06/06/21 1945  ?Weight: 98.4 kg 102 kg  ? ? ? ?Intake/Output Summary (Last 24 hours) at 06/08/2021 1646 ?Last data filed at 06/08/2021 [id number] ?Gross per 24 hour  ?Intake 2822.13 ml  ?Output 1850 ml  ?Net 972.13 ml  ? ?Net IO Since Admission: 1,430.39 mL [06/08/21 1646] ? ?Physical Exam: ?Constitutional: Well-developed male sitting up in bed talking ?HENT: normocephalic atraumatic, mucous membranes moist ?Eyes: conjunctiva non-erythematous ?Neck: supple ?Cardiovascular: Tachycardic no murmurs rubs or gallop ?Pulmonary/Chest: normal work of breathing on room air, lungs clear to auscultation bilaterally ?Abdominal: soft, non-tender, non-distended ?MSK: normal bulk and tone ?Neurological: alert & oriented x 3, 5/5 strength in bilateral upper and lower extremities, voice improved less hoarse. ?Skin: warm and dry ?Psych: Mood and affect appropriate ? ?Patient Lines/Drains/Airways Status   ? ? Active Line/Drains/Airways   ? ? Name Placement date Placement time Site Days  ?  Peripheral IV 06/06/21 18 G Right Antecubital 06/06/21  1137  Antecubital  1  ? Peripheral IV 06/06/21 20 G 1" Anterior;Left Forearm 06/06/21  1414  Forearm  1  ? ?  ?  ? ?  ? ? ?Pertinent Labs: ? ?  Latest Ref Rng & Units 06/07/2021  ?  2:57 AM 06/06/2021  ?  1:23 PM 06/06/2021  ? 12:15 PM  ?CBC  ?WBC 4.0 - 10.5 K/uL 10.8      ?Hemoglobin 13.0 - 17.0 g/dL 17.1   19.4   19.0    ?Hematocrit 39.0 - 52.0 % 50.8   57.0   56.0    ?Platelets 150 - 400 K/uL 326      ? ? ? ?  Latest Ref Rng & Units 06/08/2021  ? 10:30 AM 06/08/2021  ?  5:54 AM 06/08/2021  ?  1:48 AM  ?CMP  ?Glucose 70 - 99 mg/dL 163   151   161    ?BUN 6 - 20 mg/dL 9   10   11     ?Creatinine 0.61 - 1.24 mg/dL 0.78   0.78   0.87    ?Sodium 135 - 145 mmol/L 134   136   134    ?Potassium 3.5 - 5.1 mmol/L 3.3   3.6   3.4    ?Chloride 98 - 111 mmol/L 107   109   111    ?CO2 22 - 32 mmol/L 17   17   14     ?Calcium 8.9 -  10.3 mg/dL 7.9   8.2   8.0    ? ? ?Recent Labs  ?  06/08/21 ?0926 06/08/21 ?1030 06/08/21 ?1133  ?GLUCAP 144* 151* 148*  ?  ? ?Pertinent Imaging: ?No results found. ? ?ASSESSMENT/PLAN:  ?Assessment: ?Principal Problem: ?  DKA (diabetic ketoacidosis) ([place]) ? ? ?57-year-old male history of hypertension recent stroke presented with strokelike symptoms suspected to be recrudescent of prior stroke in the setting of DKA. ? ?Euglycemic DKA-severe DKA ?Diabetes type 2 poorly controlled, most recent A1c 9.5 ?Dehydration ?Severe anion gap metabolic acidosis ?Hypophosphatemia. ?Suspect secondary to SGLT2 inhibitor. ?Patient's Has closed x2.  He has been transitioned off the Endo tool onto long-acting IV insulin.  He will receive 10 units insulin glargine.  He has tolerated lunch well. ?Continue to monitor BMP every 4 hours. ?We will continue to closely monitor his phosphate and replete as necessary. ?  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  Discussed with neuro last night.   Recommended continuing current treatment of aspirin, Plavix, statin. ?We will continue to monitor at this time.  No further work-up. ?Outpatient PT/OT. ? ?Hypertension ?Stable.  Continue to monitor. ? ?Hyperlipidemia ?Home medications include atorvastatin 80 mg and Zetia 10 mg. ? ?Respiratory disease status post COVID infection ?Patient endorses nightly CPAP use. ? ?Signature: ?Delene Ruffini, MD  ?Internal Medicine Resident, PGY-1 ?[facility]  ?Pager: [phone] ?4:46 PM, 06/08/2021  ? ?Please contact the on call pager after 5 pm and on weekends at [phone]. ? ?

## 2021-06-09 LAB — MAGNESIUM
Magnesium: 2 mg/dL (ref 1.7–2.4)
Magnesium: 2 mg/dL (ref 1.7–2.4)
Magnesium: 2.1 mg/dL (ref 1.7–2.4)

## 2021-06-09 LAB — BASIC METABOLIC PANEL
Anion gap: 9 (ref 5–15)
Anion gap: 12 (ref 5–15)
BUN: 7 mg/dL (ref 6–20)
BUN: 7 mg/dL (ref 6–20)
CO2: 22 mmol/L (ref 22–32)
CO2: 21 mmol/L — ABNORMAL LOW (ref 22–32)
Calcium: 8.3 mg/dL — ABNORMAL LOW (ref 8.9–10.3)
Calcium: 8.2 mg/dL — ABNORMAL LOW (ref 8.9–10.3)
Chloride: 106 mmol/L (ref 98–111)
Chloride: 104 mmol/L (ref 98–111)
Creatinine, Ser: 0.66 mg/dL (ref 0.61–1.24)
Creatinine, Ser: 0.69 mg/dL (ref 0.61–1.24)
GFR, Estimated: >60 mL/min (ref >60)
GFR, Estimated: >60 mL/min (ref >60)
Glucose, Bld: 149 mg/dL — ABNORMAL HIGH (ref 70–99)
Glucose, Bld: 122 mg/dL — ABNORMAL HIGH (ref 70–99)
Potassium: 3.2 mmol/L — ABNORMAL LOW (ref 3.5–5.1)
Potassium: 3.2 mmol/L — ABNORMAL LOW (ref 3.5–5.1)
Sodium: 137 mmol/L (ref 135–145)
Sodium: 137 mmol/L (ref 135–145)

## 2021-06-09 LAB — PHOSPHORUS
Phosphorus: 2.4 mg/dL — ABNORMAL LOW (ref 2.5–4.6)
Phosphorus: 2.2 mg/dL — ABNORMAL LOW (ref 2.5–4.6)
Phosphorus: 2.2 mg/dL — ABNORMAL LOW (ref 2.5–4.6)
Phosphorus: 2.6 mg/dL (ref 2.5–4.6)

## 2021-06-09 LAB — GLUCOSE, CAPILLARY
Glucose-Capillary: 113 mg/dL — ABNORMAL HIGH (ref 70–99)
Glucose-Capillary: 118 mg/dL — ABNORMAL HIGH (ref 70–99)

## 2021-06-09 LAB — POTASSIUM: Potassium: 4.5 mmol/L (ref 3.5–5.1)

## 2021-06-09 MED ORDER — POTASSIUM CHLORIDE CRYS ER 20 MEQ PO TBCR
40.0000 meq | EXTENDED_RELEASE_TABLET | Freq: Two times a day (BID) | ORAL | Status: AC
  Administered 2021-06-09 (×2): 40 meq via ORAL
  Filled 2021-06-09 (×2): qty 2

## 2021-06-09 MED ORDER — INSULIN PEN NEEDLE 32G X 4 MM MISC: 10.0000 [IU] | Freq: Every day | 0 refills | Status: AC

## 2021-06-09 MED ORDER — BLOOD GLUCOSE METER KIT: PACK | 0 refills | Status: AC

## 2021-06-09 MED ORDER — K PHOS MONO-SOD PHOS DI & MONO 155-852-130 MG PO TABS
500.0000 mg | ORAL_TABLET | Freq: Once | ORAL | Status: AC
  Administered 2021-06-09: 500 mg via ORAL
  Filled 2021-06-09: qty 2

## 2021-06-09 MED ORDER — K PHOS MONO-SOD PHOS DI & MONO 155-852-130 MG PO TABS
250.0000 mg | ORAL_TABLET | Freq: Once | ORAL | Status: AC
  Administered 2021-06-09: 250 mg via ORAL
  Filled 2021-06-09: qty 1

## 2021-06-09 MED ORDER — INSULIN GLARGINE-YFGN 100 UNIT/ML ~~LOC~~ SOLN: 10.0000 [IU] | Freq: Every day | SUBCUTANEOUS | 0 refills | Status: AC

## 2021-06-09 NOTE — Plan of Care (Signed)
?  Problem: Education: ?Goal: Knowledge of General Education information will improve ?Description: Including pain rating scale, medication(s)/side effects and non-pharmacologic comfort measures ?Outcome: Adequate for Discharge ?  ?Problem: Health Behavior/Discharge Planning: ?Goal: Ability to manage health-related needs will improve ?Outcome: Adequate for Discharge ?  ?Problem: Clinical Measurements: ?Goal: Ability to maintain clinical measurements within normal limits will improve ?Outcome: Adequate for Discharge ?Goal: Will remain free from infection ?Outcome: Adequate for Discharge ?Goal: Diagnostic test results will improve ?Outcome: Adequate for Discharge ?Goal: Respiratory complications will improve ?Outcome: Adequate for Discharge ?Goal: Cardiovascular complication will be avoided ?Outcome: Adequate for Discharge ?  ?Problem: Activity: ?Goal: Risk for activity intolerance will decrease ?Outcome: Adequate for Discharge ?  ?Problem: Nutrition: ?Goal: Adequate nutrition will be maintained ?Outcome: Adequate for Discharge ?  ?Problem: Coping: ?Goal: Level of anxiety will decrease ?Outcome: Adequate for Discharge ?  ?Problem: Elimination: ?Goal: Will not experience complications related to bowel motility ?Outcome: Adequate for Discharge ?Goal: Will not experience complications related to urinary retention ?Outcome: Adequate for Discharge ?  ?Problem: Pain Managment: ?Goal: General experience of comfort will improve ?Outcome: Adequate for Discharge ?  ?Problem: Safety: ?Goal: Ability to remain free from injury will improve ?Outcome: Adequate for Discharge ?  ?Problem: Skin Integrity: ?Goal: Risk for impaired skin integrity will decrease ?Outcome: Adequate for Discharge ?  ?Problem: Acute Rehab OT Goals (only OT should resolve) ?Goal: Pt. Will Perform Lower Body Dressing ?Outcome: Adequate for Discharge ?Goal: Pt. Will Transfer To Toilet ?Outcome: Adequate for Discharge ?Goal: Pt. Will Perform Toileting-Clothing  Manipulation ?Outcome: Adequate for Discharge ?Goal: OT Additional ADL Goal #1 ?Outcome: Adequate for Discharge ?  ?Problem: Acute Rehab PT Goals(only PT should resolve) ?Goal: Pt Will Go Supine/Side To Sit ?Outcome: Adequate for Discharge ?Goal: Pt Will Go Sit To Supine/Side ?Outcome: Adequate for Discharge ?Goal: Patient Will Transfer Sit To/From Stand ?Outcome: Adequate for Discharge ?Goal: Pt Will Ambulate ?Outcome: Adequate for Discharge ?Goal: Pt Will Go Up/Down Stairs ?Outcome: Adequate for Discharge ?  ?

## 2021-06-09 NOTE — Plan of Care (Signed)

## 2021-06-09 NOTE — Progress Notes (Signed)
Patient given discharge information and stated understanding. 

## 2021-06-09 NOTE — Progress Notes (Signed)
Occupational Therapy Treatment ?Patient Details ?Name: Frank Cuevas ?MRN: [id number] ?DOB: [birth year] ?Today's Date: 06/09/2021 ? ? ?History of present illness Patient is a 51 year old male who presents 06/06/2021 with complaints of heavy breathing, tachycardia, slurred speech trouble swallowing. Diagnosed with DKA, dehydration. 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 Petechial blood products without frank hematoma. Mild  swelling but no compromise of the fourth ventricle. 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  ?OT comments ? Pt completed bed mobility with supervision with cues on pacing and positioning. Pt was able to complete sit to stand transfers with min guard and ambulation with FW with max HR at 134. Pt was able to complete LB dressing with min guard to supervision while sitting at EOB . Pt currently with functional limitations due to the deficits listed below (see OT Problem List).  Pt will benefit from skilled OT to increase their safety and independence with ADL and functional mobility for ADL to facilitate discharge to venue listed below.  ?  ? ?Recommendations for follow up therapy are one component of a multi-disciplinary discharge planning process, led by the attending physician.  Recommendations may be updated based on patient status, additional functional criteria and insurance authorization. ?   ?Follow Up Recommendations ? Other (comment) (OP PT which is set up with patient for tomorrow)  ?  ?Assistance Recommended at Discharge Intermittent Supervision/Assistance  ?Patient can return home with the following ? A little help with walking and/or transfers;A little help with bathing/dressing/bathroom;Assist for transportation ?  ?Equipment Recommendations ? None recommended by OT  ?  ?Recommendations for Other Services   ? ?  ?Precautions / Restrictions Precautions ?Precautions: Fall;Other  (comment) ?Precaution Comments: watch HR (Max HR up to 134 with ambulation in session) ?Restrictions ?Weight Bearing Restrictions: No  ? ? ?  ? ?Mobility Bed Mobility ?Overal bed mobility: Needs Assistance ?Bed Mobility: Supine to Sit, Sit to Supine ?Rolling: Supervision ?Sidelying to sit: Supervision ?Supine to sit: Supervision ?Sit to supine: Supervision ?  ?General bed mobility comments: Pt was able to complete with no bed rail or HOB elevation but cued on speed ?  ? ?Transfers ?Overall transfer level: Needs assistance ?Equipment used: Rolling walker (2 wheels) ?Transfers: Sit to/from Stand ?Sit to Stand: Supervision ?  ?  ?  ?  ?  ?  ?  ?  ?Balance Overall balance assessment: Needs assistance ?Sitting-balance support: Feet supported ?Sitting balance-Leahy Scale: Good ?  ?  ?Standing balance support: During functional activity, No upper extremity supported ?Standing balance-Leahy Scale: Fair ?  ?  ?  ?  ?  ?  ?  ?  ?  ?  ?  ?  ?   ? ?ADL either performed or assessed with clinical judgement  ? ?ADL Overall ADL's : Needs assistance/impaired ?Eating/Feeding: Independent;Sitting ?  ?Grooming: Oral care;Wash/dry face;Wash/dry hands;Min guard;Standing ?  ?Upper Body Bathing: Set up;Sitting ?  ?Lower Body Bathing: Min guard;Sit to/from stand ?  ?Upper Body Dressing : Set up;Sitting ?  ?Lower Body Dressing: Min guard;Sit to/from stand ?  ?Toilet Transfer: Min guard;Rolling walker (2 wheels) ?  ?Toileting- Architect and Hygiene: Min guard;Sit to/from stand ?  ?  ?  ?Functional mobility during ADLs: Min guard;Rolling walker (2 wheels) ?  ?  ? ?Extremity/Trunk Assessment Upper Extremity Assessment ?Upper Extremity Assessment: Generalized weakness (WFL to complete ADLS) ?  ?  ?  ?  ?  ? ?Vision   ?  ?  ?  Perception   ?  ?Praxis   ?  ? ?Cognition Arousal/Alertness: Awake/alert ?Behavior During Therapy: [facility] for tasks assessed/performed ?Overall Cognitive Status: Within Functional Limits for tasks assessed ?  ?  ?  ?   ?  ?  ?  ?  ?  ?  ?  ?  ?  ?  ?  ?  ?  ?  ?  ?   ?Exercises   ? ?  ?Shoulder Instructions   ? ? ?  ?General Comments    ? ? ?Pertinent Vitals/ Pain       Pain Assessment ?Pain Assessment: No/denies pain ? ?Home Living   ?  ?  ?  ?  ?  ?  ?  ?  ?  ?  ?  ?  ?  ?  ?  ?  ?  ?  ? ?  ?Prior Functioning/Environment    ?  ?  ?  ?   ? ?Frequency ? Min 2X/week  ? ? ? ? ?  ?Progress Toward Goals ? ?OT Goals(current goals can now be found in the care plan section) ? Progress towards OT goals: Progressing toward goals ? ?Acute Rehab OT Goals ?Patient Stated Goal: to be able to go home today ?OT Goal Formulation: With patient ?Time For Goal Achievement: 06/21/21 ?Potential to Achieve Goals: Good ?ADL Goals ?Pt Will Perform Lower Body Dressing: with modified independence;sit to/from stand;sitting/lateral leans ?Pt Will Transfer to Toilet: with modified independence;ambulating ?Pt Will Perform Toileting - Clothing Manipulation and hygiene: with modified independence;sit to/from stand;sitting/lateral leans ?Additional ADL Goal #1: Patient will tolerate 15 minutes standing activity in order to participate in daily routine.  ?Plan Discharge plan remains appropriate   ? ?Co-evaluation ? ? ?   ?  ?  ?  ?  ? ?  ?AM-PAC OT "6 Clicks" Daily Activity     ?Outcome Measure ? ? Help from another person eating meals?: None ?Help from another person taking care of personal grooming?: A Little ?Help from another person toileting, which includes using toliet, bedpan, or urinal?: A Little ?Help from another person bathing (including washing, rinsing, drying)?: A Little ?Help from another person to put on and taking off regular upper body clothing?: None ?Help from another person to put on and taking off regular lower body clothing?: A Little ?6 Click Score: 20 ? ?  ?End of Session Equipment Utilized During Treatment: Rolling walker (2 wheels) ? ?OT Visit Diagnosis: Unsteadiness on feet (R26.81) ?  ?Activity Tolerance Patient tolerated treatment  well ?  ?Patient Left in bed;with call bell/phone within reach;with family/visitor present ?  ?Nurse Communication   ?  ? ?   ? ?Time: 9323-5573 ?OT Time Calculation (min): 31 min ? ?Charges: OT General Charges ?$OT Visit: 1 Visit ?OT Treatments ?$Self Care/Home Management : 23-37 mins ? ?Alphia Moh OTR/L  ?Acute Rehab Services  ?[phone] office number ?[phone] pager number ? ? ?Alphia Moh ?06/09/2021, 10:21 AM ?

## 2021-06-09 NOTE — Discharge Summary (Addendum)
? ?Name: Frank Cuevas ?MRN: [id number] ?DOB: 02/17/1971 51 y.o. ?PCP: [facility] ? ?Date of Admission: 06/06/2021 11:30 AM ?Date of Discharge: 06/09/21 ?Attending Physician: Charise Killian, MD ? ?Discharge Diagnosis: ?1. Euglycemic DKA-severe DKA ?2. Diabetes type 2 poorly controlled, most recent A1c 9.5 ?3. Dehydration ?4. Severe anion gap metabolic acidosis ?5. Hypophosphatemia. ?6. Strokelike symptoms ?7. 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 Hypertension ?9. Hyperlipidemia ?10. Respiratory disease status post COVID infection ? ?Discharge Medications: ?Allergies as of 06/09/2021   ? ?   Reactions  ? Onion Anaphylaxis  ? ?  ? ?  ?Medication List  ?  ? ?STOP taking these medications   ? ?Jardiance 25 MG Tabs tablet ?Generic drug: empagliflozin ?  ? ?  ? ?TAKE these medications   ? ?aspirin 81 MG EC tablet ?Take 1 tablet (81 mg total) by mouth daily. Swallow whole. ?  ?atorvastatin 80 MG tablet ?Commonly known as: LIPITOR ?Take 1 tablet (80 mg total) by mouth daily. ?  ?blood glucose meter kit and supplies ?Dispense based on patient and insurance preference. Use up to four times daily as directed. (FOR ICD-10 E10.9, E11.9). ?  ?clopidogrel 75 MG tablet ?Commonly known as: PLAVIX ?Take 1 tablet (75 mg total) by mouth daily. ?  ?ezetimibe 10 MG tablet ?Commonly known as: ZETIA ?Take 1 tablet (10 mg total) by mouth daily. ?  ?insulin glargine-yfgn 100 UNIT/ML injection ?Commonly known as: SEMGLEE ?Inject 0.1 mLs (10 Units total) into the skin daily. ?  ?Insulin Pen Needle 32G X 4 MM Misc ?10 Units by Does not apply route daily. ?  ?ondansetron 4 MG disintegrating tablet ?Commonly known as: ZOFRAN-ODT ?Take 1 tablet (4 mg total) by mouth every 8 (eight) hours as needed for nausea or vomiting. ?  ?ondansetron 4 MG tablet ?Commonly known as: Zofran ?Take 1 tablet (4 mg total) by mouth every 8 (eight) hours as needed for nausea or vomiting. ?  ?OVER THE COUNTER MEDICATION ?Take 2 tablets by  mouth in the morning and at bedtime. Relief factor ?  ?pioglitazone 30 MG tablet ?Commonly known as: ACTOS ?Take 30 mg by mouth daily. ?  ?sildenafil 100 MG tablet ?Commonly known as: VIAGRA ?Take 100 mg by mouth daily as needed for erectile dysfunction. ?  ? ?  ? ? ?Disposition and follow-up:   ?Frank Cuevas was discharged from [facility] in Stable condition.  At the hospital follow up visit please address: ? ?Diabetes-  ?Pioglitazone, insulin glargine ?Stop jardiance ? ?Strokelike symptoms. ?Thought recrudescence of prior stroke in the setting of DKA.  Symptoms have returned to baseline prior to hospitalization.  Needing outpatient PT OT.  Aspirin, statin, Plavix, Zetia ? ?Labs / imaging needed at time of follow-up: BMP, mag, phos ? ?Pending labs/ test needing follow-up: none ? ?Follow-up Appointments: ? Follow-up Information   ? ? [facility] Follow up in 1 week(s).   ?Specialty: Family Medicine ? ?  ?  ? ?  ?  ? ?  ?PCP ?Neurology ?Endocrine ? ?Hospital Course by problem list:  ? ?Euglycemic DKA-severe DKA ?Patient presented to the emergency department complaining of strokelike symptoms, was found to be in euglycemic DKA.  He was started on insulin gtt.  Closely monitored due to acidosis 6.9 and elevated lactate.  Initial gap unable to be fully calculated due to immeasurable bicarb.  Bicarb likely compensatory mechanism due to patient's rapid breathing in the days preceding ED presentation.  He  received IV fluids, potassium, insulin.  Overall he improved and was able to be transitioned over to long-acting insulin.  He tolerated diet well.  His stroke signs improved as DKA improved.  Magnesium and phosphorus were closely monitored.  Phosphorus was repleted as well as potassium throughout his stay.  His Jardiance was thought likely to be the cause of his DKA.  PE was ruled out as an initial etiology.  Jardiance was discontinued. Based on insulin needs was discharged  home with 10 units long-acting insulin glargine and advised to continue pioglitazone.  Education on insulin, glucose monitoring, and hypoglycemia provided. Instructed to follow-up closely with his PCP.  Given a list of endocrinologist to establish with. ?  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  He obtained a CT scan in the emergency department that showed improvement in blood flow and no acute findings.  Underwent MRI which showed subacute stroke and similar distribution area as prior.  Discussed with neurology who recommend no change in current management.  He was continued on aspirin, Plavix, statin, Zetia.  His neurologic symptoms gradually improved throughout hospitalization as his DKA improved, suspect recrudescence of prior symptoms in the setting of acute illness.  He was back to his prior baseline upon discharge.  He was advised to follow-up with neurology. ? ? ?Subjective: ?Patient assessed at bedside this AM. He Cuevas that he feels like he has more energy than yesterday. He had several questions about his treatment therapy. These were answered. He Cuevas that he met with the diabetic coordinator and found this helpful. No other concerns at this time. ? ?Discharge Exam:   ?BP (!) 145/99 (BP Location: Left Arm)   Pulse 99   Temp 98.8 ?F (37.1 ?C) (Oral)   Resp 17   Ht _0  (1.88 m)   Wt 102 kg   SpO2 94%   BMI 28.87 kg/m?  ?Discharge exam:  ?Constitutional: Well-developed male sitting up in bed talking ?HENT: normocephalic atraumatic, mucous membranes moist ?Eyes: conjunctiva non-erythematous ?Neck: supple ?Cardiovascular: Regular rate and rhythm no murmurs rubs or gallop ?Pulmonary/Chest: normal work of breathing on room air, lungs clear to auscultation bilaterally ?MSK: normal bulk and tone ?Neurological: alert & oriented x 3, voice back to baseline. ?Skin: warm and  dry ?Psych: Mood and affect appropriate ? ?Pertinent Labs, Studies, and Procedures:  ? ?  Latest Ref Rng & Units 06/07/2021  ?  2:57 AM 06/06/2021  ?  1:23 PM 06/06/2021  ? 12:15 PM  ?CBC  ?WBC 4.0 - 10.5 K/uL 10.8      ?Hemoglobin 13.0 - 17.0 g/dL 17.1   19.4   19.0    ?Hematocrit 39.0 - 52.0 % 50.8   57.0   56.0    ?Platelets 150 - 400 K/uL 326      ? ? ?  Latest Ref Rng & Units 06/09/2021  ?  8:56 AM 06/09/2021  ?  5:56 AM 06/09/2021  ? 12:31 AM  ?BMP  ?Glucose 70 - 99 mg/dL  122   149    ?BUN 6 - 20 mg/dL  7   7    ?Creatinine 0.61 - 1.24 mg/dL  0.69   0.66    ?Sodium 135 - 145 mmol/L  137   137    ?Potassium 3.5 - 5.1 mmol/L 4.5   3.2   3.2    ?Chloride 98 - 111 mmol/L  104   106    ?  CO2 22 - 32 mmol/L  21   22    ?Calcium 8.9 - 10.3 mg/dL  8.2   8.3    ?  ? ?Discharge Instructions: ?Discharge Instructions   ? ? Call MD for:  difficulty breathing, headache or visual disturbances   Complete by: As directed ?  ? Call MD for:  extreme fatigue   Complete by: As directed ?  ? Call MD for:  hives   Complete by: As directed ?  ? Call MD for:  persistant dizziness or light-headedness   Complete by: As directed ?  ? Call MD for:  persistant nausea and vomiting   Complete by: As directed ?  ? Call MD for:  redness, tenderness, or signs of infection (pain, swelling, redness, odor or green/yellow discharge around incision site)   Complete by: As directed ?  ? Call MD for:  severe uncontrolled pain   Complete by: As directed ?  ? Call MD for:  temperature >100.4   Complete by: As directed ?  ? Diet - low sodium heart healthy   Complete by: As directed ?  ? Increase activity slowly   Complete by: As directed ?  ? ?  ? ? ?Signed: ?Delene Ruffini, MD ?06/09/2021, 4:21 PM   ?Pager: _0 @  ?

## 2021-06-09 NOTE — Progress Notes (Signed)
Physical Therapy Treatment ?Patient Details ?Name: Frank Cuevas ?MRN: [id number] ?DOB: 04-07-1970 ?Today's Date: 06/09/2021 ? ? ?History of Present Illness Patient is a 51 year old male who presents 06/06/2021 with complaints of heavy breathing, tachycardia, slurred speech trouble swallowing. Diagnosed with DKA, dehydration. 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 Petechial blood products without frank hematoma. Mild  swelling but no compromise of the fourth ventricle. 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 ?  ?PT Comments  ? ? Pt was seen for gait and discussion of recovery and follow up with therapy.  He is attended by his family, and will be home with him for assistance of safety with gait and balance.  Pt is not very unstable with RW, tending to need at most min guard for balance.  He is aware of the concerns for safety to get up to walk, and demonstrates a great acceptance of responsibility for calling for help.  Monitor him for HR with mobility, work on high level balance skills and progress to longer walks as his CV symptoms permit.  Monitor for arrhythmias and loss of balance.  ?Recommendations for follow up therapy are one component of a multi-disciplinary discharge planning process, led by the attending physician.  Recommendations may be updated based on patient status, additional functional criteria and insurance authorization. ? ?Follow Up Recommendations ? Outpatient PT ?  ?  ?Assistance Recommended at Discharge PRN  ?Patient can return home with the following Assist for transportation;Help with stairs or ramp for entrance;Assistance with cooking/housework;A little help with bathing/dressing/bathroom ?  ?Equipment Recommendations ? None recommended by PT  ?  ?Recommendations for Other Services   ? ? ?  ?Precautions / Restrictions Precautions ?Precautions: Fall;Other (comment) ?Precaution Comments: watch  HR ?Restrictions ?Weight Bearing Restrictions: No  ?  ? ?Mobility ? Bed Mobility ?Overal bed mobility: Needs Assistance ?Bed Mobility: Supine to Sit, Sit to Supine ?Rolling: Supervision ?  ?Supine to sit: Supervision ?Sit to supine: Supervision ?  ?General bed mobility comments: safe transition to side of bed ?  ? ?Transfers ?Overall transfer level: Needs assistance ?Equipment used: Rolling walker (2 wheels) ?Transfers: Sit to/from Stand ?Sit to Stand: Supervision ?  ?  ?  ?  ?  ?  ?  ? ?Ambulation/Gait ?Ambulation/Gait assistance: Min guard ?Gait Distance (Feet): 150 Feet ?Assistive device: Rolling walker (2 wheels) ?Gait Pattern/deviations: Step-through pattern, Wide base of support ?Gait velocity: decreased ?Gait velocity interpretation: <1.31 ft/sec, indicative of household ambulator ?Pre-gait activities: standing balance ck ?General Gait Details: sats were controlled and HR was 130 max ? ? ?Stairs ?  ?  ?  ?  ?  ? ? ?Wheelchair Mobility ?  ? ?Modified Rankin (Stroke Patients Only) ?  ? ? ?  ?Balance Overall balance assessment: Needs assistance ?Sitting-balance support: Feet supported ?Sitting balance-Leahy Scale: Good ?  ?  ?  ?Standing balance-Leahy Scale: Fair ?  ?  ?  ?  ?  ?  ?  ?  ?  ?  ?  ?  ?  ? ?  ?Cognition Arousal/Alertness: Awake/alert ?Behavior During Therapy: [facility] for tasks assessed/performed ?Overall Cognitive Status: Within Functional Limits for tasks assessed ?  ?  ?  ?  ?  ?  ?  ?  ?  ?  ?  ?  ?  ?  ?  ?  ?  ?  ?  ? ?  ?Exercises   ? ?  ?General  Comments General comments (skin integrity, edema, etc.): Pt was assisted to sit and balance on side of bed, with walk monitored for vitals esp HR.  Pt is at 120 most of walk but near end max HR was 130 ?  ?  ? ?Pertinent Vitals/Pain Pain Assessment ?Pain Assessment: No/denies pain  ? ? ?Home Living   ?  ?  ?  ?  ?  ?  ?  ?  ?  ?   ?  ?Prior Function    ?  ?  ?   ? ?PT Goals (current goals can now be found in the care plan section) Progress towards PT  goals: Progressing toward goals ? ?  ?Frequency ? ? ? Min 3X/week ? ? ? ?  ?PT Plan Current plan remains appropriate  ? ? ?Co-evaluation   ?  ?  ?  ?  ? ?  ?AM-PAC PT "6 Clicks" Mobility   ?Outcome Measure ? Help needed turning from your back to your side while in a flat bed without using bedrails?: None ?Help needed moving from lying on your back to sitting on the side of a flat bed without using bedrails?: A Little ?Help needed moving to and from a bed to a chair (including a wheelchair)?: A Little ?Help needed standing up from a chair using your arms (e.g., wheelchair or bedside chair)?: A Little ?Help needed to walk in hospital room?: A Little ?Help needed climbing 3-5 steps with a railing? : A Little ?6 Click Score: 19 ? ?  ?End of Session Equipment Utilized During Treatment: Gait belt ?Activity Tolerance: Patient tolerated treatment well ?Patient left: in bed;with call bell/phone within reach;with family/visitor present ?Nurse Communication: Mobility status ?PT Visit Diagnosis: Other abnormalities of gait and mobility (R26.89);Other symptoms and signs involving the nervous system (R29.898);Dizziness and giddiness (R42) ?  ? ? ?Time: 8916-9450 ?PT Time Calculation (min) (ACUTE ONLY): 26 min ? ?Charges:  $Gait Training: 8-22 mins ?$Therapeutic Activity: 8-22 mins    ?Ivar Drape ?06/09/2021, 1:59 PM ? ?Samul Dada, PT PhD ?Acute Rehab Dept. Number: [facility] [phone] and [facility] [phone] ? ? ?

## 2021-06-10 ENCOUNTER — Other Ambulatory Visit: Payer: Self-pay | Admitting: Internal Medicine

## 2021-06-10 ENCOUNTER — Ambulatory Visit: Payer: BC Managed Care – PPO | Admitting: Physical Therapy

## 2021-06-12 ENCOUNTER — Encounter: Payer: Self-pay | Admitting: Physical Therapy

## 2021-06-12 ENCOUNTER — Ambulatory Visit: Payer: BC Managed Care – PPO | Attending: Nurse Practitioner | Admitting: Physical Therapy

## 2021-06-12 DIAGNOSIS — M6281 Muscle weakness (generalized): Secondary | ICD-10-CM

## 2021-06-12 DIAGNOSIS — R6889 Other general symptoms and signs: Secondary | ICD-10-CM

## 2021-06-12 DIAGNOSIS — R262 Difficulty in walking, not elsewhere classified: Secondary | ICD-10-CM | POA: Diagnosis not present

## 2021-06-12 DIAGNOSIS — I639 Cerebral infarction, unspecified: Secondary | ICD-10-CM | POA: Insufficient documentation

## 2021-06-12 NOTE — Patient Instructions (Signed)
Access Code: [email or web address] ?URL: [email or web address] ?Date: 06/12/2021 ?Prepared by: Isabelle Course ? ?Exercises ?- Sit to Stand Without Arm Support  - 3-4 x daily - 7 x weekly - 1 sets - 10 reps ?- Walking  - 1 x daily - 7 x weekly - 5 sets - 1 reps - 5-7 minutes hold ?- Standing with Head Rotation  - 1 x daily - 7 x weekly - 3 sets - 10 reps ?

## 2021-06-12 NOTE — Therapy (Signed)
?[facility] ?[address] ?[place], [place], [address] ?Phone: [phone]   Fax:  [phone] ? ?Physical Therapy Evaluation ? ?Patient Details  ?Name: Frank Cuevas ?MRN: [id number] ?Date of Birth: 1970-10-16 ?Referring Provider (PT): Heloise PurpuraWhiteheart, Samara DeistKathryn ? ? ?Encounter Date: 06/12/2021 ? ? PT End of Session - 06/12/21 1217   ? ? Visit Number 1   ? Number of Visits 6   ? Date for PT Re-Evaluation 07/24/21   ? Authorization Type BCBS   ? PT Start Time 1138   ? PT Stop Time 1215   ? PT Time Calculation (min) 37 min   ? Equipment Utilized During Treatment Gait belt   ? Activity Tolerance Patient tolerated treatment well   ? [facility] tasks assessed/performed   ? ?  ?  ? ?  ? ? ?Past Medical History:  ?Diagnosis Date  ? Diabetes mellitus without complication (HCC)   ? ? ?Past Surgical History:  ?Procedure Laterality Date  ? IR ANGIO INTRA EXTRACRAN SEL INTERNAL CAROTID BILAT MOD SED  06/02/2021  ? IR ANGIO VERTEBRAL SEL VERTEBRAL BILAT MOD SED  06/02/2021  ? IR US GUIDE VASC ACCESS RIGHT  06/02/2021  ? NO PAST SURGERIES    ? ? ?There were no vitals filed for this visit. ? ? ? Subjective Assessment - 06/12/21 1129   ? ? Subjective Pt had a CVA 05/30/21 which effected the cerebellum. Pt was in the hospital x 4 days and then d/c'd home with supervision. A few days later pt had an episode of DKA which exacerbated CVA symptoms. He was again hospitalized and has returned home with supervision. Since returning home pt states he has decreased energy but that his dizziness is "not bad" and he feels his balance is "ok". His chief complaint is decreased energy.   ? Pertinent History DM, CVA   ? Patient Stated Goals maintain balance, increased strength and endurance   ? Currently in Pain? No/denies   ? ?  ?  ? ?  ? ? ? ? ? [facility] PT Assessment - 06/12/21 0001   ? ?  ? Assessment  ? Medical Diagnosis CVA   ? Referring Provider (PT) Danford BadWhiteheart, Kathryn   ? Onset  Date/Surgical Date 05/30/21   ? Next MD Visit 06/18/21   ?  ? Precautions  ? Precautions None   ?  ? Restrictions  ? Weight Bearing Restrictions No   ?  ? Balance Screen  ? Has the patient fallen in the past 6 months No   ?  ? Prior Function  ? Level of [place] Independent   ? Vocation Requirements painting and "making widgets"   ?  ? Observation/Other Assessments  ? Focus on Therapeutic Outcomes (FOTO)  not indicated   ?  ? ROM / Strength  ? AROM / PROM / Strength Strength   ?  ? Strength  ? Overall Strength Comments overall LE strength grossly 4/5   ?  ? Special Tests  ? Other special tests no dizziness with VOR x 1 vertical or horizontal   ?  ? Transfers  ? Five time sit to stand comments  16.9 seconds   ?  ? Ambulation/Gait  ? Gait Comments normal gait pattern   ?  ? Standardized Balance Assessment  ? Standardized Balance Assessment Dynamic Gait Index   ?  ? Dynamic Gait Index  ? Level Surface Normal   ? Change in Gait Speed Mild Impairment   ?  Gait with Horizontal Head Turns Moderate Impairment   ? Gait with Vertical Head Turns Mild Impairment   ? Gait and Pivot Turn Mild Impairment   ? Step Over Obstacle Mild Impairment   ? Step Around Obstacles Mild Impairment   ? Steps Normal   ? Total Score 17   ? ?  ?  ? ?  ? ? ? ? ? ? ? ? ? ? ? ? ? ?Objective measurements completed on examination: See above findings.  ? ? ? ? ? [facility] Adult PT Treatment/Exercise - 06/12/21 0001   ? ?  ? Exercises  ? Exercises Knee/Hip   ?  ? Knee/Hip Exercises: Standing  ? Other Standing Knee Exercises standing march with head turns x 10 with UE support   ?  ? Knee/Hip Exercises: Seated  ? Sit to [employer] 10 reps   ? ?  ?  ? ?  ? ? ? ? ? ? ? ? ? ? PT Education - 06/12/21 1207   ? ? Education Details PT POC and goals, HEP   ? Person(s) Educated Patient   ? Methods Explanation;Demonstration;Handout   ? Comprehension Returned demonstration;Verbalized understanding   ? ?  ?  ? ?  ? ? ? ? ? ? PT Long Term Goals - 06/12/21 1221   ? ?  ? PT LONG  TERM GOAL #1  ? Title Pt will be independent in HEP   ? Time 6   ? Period Weeks   ? Status New   ? Target Date 07/24/21   ?  ? PT LONG TERM GOAL #2  ? Title Pt will improve 5x STS to <= 12 seconds to demo improved strength   ? Time 6   ? Period Weeks   ? Status New   ? Target Date 07/24/21   ?  ? PT LONG TERM GOAL #3  ? Title Pt will tolerate walking x 10 minutes without seated rest   ? Time 6   ? Period Weeks   ? Status New   ? Target Date 07/24/21   ?  ? PT LONG TERM GOAL #4  ? Title Pt will improve DGI to >= 20 to demo improved balance   ? Time 6   ? Period Weeks   ? Status New   ? Target Date 07/24/21   ? ?  ?  ? ?  ? ? ? ? ? ? ? ? ? Plan - 06/12/21 1218   ? ? Clinical Impression Statement Pt is a 51 y/o male referred s/p CVA and DKA. Pt presents with decreased strength and endurance, impaired balance and dynamic gait. Pt will benefit from skilled PT to address deficits and improve functional mobility and decrease risk of falls   ? Personal Factors and Comorbidities Comorbidity 3+;Time since onset of injury/illness/exacerbation   ? Examination-Activity Limitations Transfers;Locomotion Level   ? Examination-Participation Restrictions Community Activity;Occupation;Driving;Yard Work;Cleaning   ? Stability/Clinical Decision Making Evolving/Moderate complexity   ? Clinical Decision Making Moderate   ? Rehab Potential Good   ? PT Frequency 1x / week   ? PT Duration 6 weeks   ? PT Treatment/Interventions Energy conservation;Manual techniques;Patient/family education;Therapeutic exercise;Therapeutic activities;Balance training;Neuromuscular re-education;Gait training;DME Instruction   ? PT Next Visit Plan assess HEP, 6 min walk test?, progress strength and endurance   ? PT Home Exercise Plan [email or web address]   ? Consulted and Agree with Plan of Care Patient   ? ?  ?  ? ?  ? ? ?  Patient will benefit from skilled therapeutic intervention in order to improve the following deficits and impairments:  Decreased activity tolerance,  Decreased endurance, Decreased balance, Decreased strength ? ?Visit Diagnosis: ?Decreased functional activity tolerance - Plan: PT plan of care cert/re-cert ? ?Difficulty in walking, not elsewhere classified - Plan: PT plan of care cert/re-cert ? ?Muscle weakness (generalized) - Plan: PT plan of care cert/re-cert ? ? ? ? ?Problem List ?Patient Active Problem List  ? Diagnosis Date Noted  ? DKA (diabetic ketoacidosis) (HCC) 06/06/2021  ? Stroke determined by clinical assessment (HCC) 05/31/2021  ? ? ?Pearlean Sabina, PT ?06/12/2021, 12:24 PM ? ?[place] ?[facility] ?[address] ?[place], [place], [address] ?Phone: [phone]   Fax:  [phone] ? ?Name: Frank Cuevas ?MRN: [id number] ?Date of Birth: 10/23/1970 ? ? ?

## 2021-06-15 ENCOUNTER — Telehealth: Payer: Self-pay | Admitting: Neurology

## 2021-06-15 NOTE — Telephone Encounter (Signed)
Pt called wanted to let Dr. Pearlean Brownie know he wants to participate in the medical trial Dr. Pearlean Brownie mentioned to him in the hospital.  ?Pt cannot find paperwork on names of trial, believes it has something to do with blood thinner medication.  ?Would like a call back to discuss further.  ?

## 2021-06-16 NOTE — Telephone Encounter (Signed)
Gave this note to [place]. ?

## 2021-06-17 ENCOUNTER — Ambulatory Visit (INDEPENDENT_AMBULATORY_CARE_PROVIDER_SITE_OTHER): Payer: Self-pay | Admitting: Neurology

## 2021-06-17 ENCOUNTER — Encounter: Payer: Self-pay | Admitting: Neurology

## 2021-06-17 VITALS — BP 127/76 | HR 85 | Ht 74.0 in | Wt 226.0 lb

## 2021-06-17 DIAGNOSIS — I6502 Occlusion and stenosis of left vertebral artery: Secondary | ICD-10-CM

## 2021-06-17 DIAGNOSIS — I639 Cerebral infarction, unspecified: Secondary | ICD-10-CM

## 2021-06-17 NOTE — Patient Instructions (Signed)
I had a long d/w patient and his girlfriend about his recent  cerebellar stroke, vertebral artery stenosis,risk for recurrent stroke/TIAs, personally independently reviewed imaging studies and stroke evaluation results and answered questions.Continue aspirin 81 mg daily and  Plavix (clopidogrel)  75 mg daily  for secondary stroke prevention and maintain strict control of hypertension with blood pressure goal below 130/90, diabetes with hemoglobin A1c goal below 6.5% and lipids with LDL cholesterol goal below 70 mg/dL. I also advised the patient to eat a healthy diet with plenty of whole grains, cereals, fruits and vegetables, exercise regularly and maintain ideal body weight .patient is interested in participating in the  CAPTIVA stroke prevention study (aspirin and Plavix compared to aspirin and Brilinta or aspirin and low-dose Xarelto ) and will be given information to review and decide.  Followup in the future with me in 6 months or call earlier if necessary. ?Stroke Prevention ?Some medical conditions and behaviors can lead to a higher chance of having a stroke. You can help prevent a stroke by eating healthy, exercising, not smoking, and managing any medical conditions you have. ?Stroke is a leading cause of functional impairment. Primary prevention is particularly important because a majority of strokes are first-time events. Stroke changes the lives of not only those who experience a stroke but also their family and other caregivers. ?How can this condition affect me? ?A stroke is a medical emergency and should be treated right away. A stroke can lead to brain damage and can sometimes be life-threatening. If a person gets medical treatment right away, there is a better chance of surviving and recovering from a stroke. ?What can increase my risk? ?The following medical conditions may increase your risk of a stroke: ?Cardiovascular disease. ?High blood pressure (hypertension). ?Diabetes. ?High  cholesterol. ?Sickle cell disease. ?Blood clotting disorders (hypercoagulable state). ?Obesity. ?Sleep disorders (obstructive sleep apnea). ?Other risk factors include: ?Being older than age 34. ?Having a history of blood clots, stroke, or mini-stroke (transient ischemic attack, TIA). ?Genetic factors, such as race, ethnicity, or a family history of stroke. ?Smoking cigarettes or using other tobacco products. ?Taking birth control pills, especially if you also use tobacco. ?Heavy use of alcohol or drugs, especially cocaine and methamphetamine. ?Physical inactivity. ?What actions can I take to prevent this? ?Manage your health conditions ?High cholesterol levels. ?Eating a healthy diet is important for preventing high cholesterol. If cholesterol cannot be managed through diet alone, you may need to take medicines. ?Take any prescribed medicines to control your cholesterol as told by your health care provider. ?Hypertension. ?To reduce your risk of stroke, try to keep your blood pressure below 130/80. ?Eating a healthy diet and exercising regularly are important for controlling blood pressure. If these steps are not enough to manage your blood pressure, you may need to take medicines. ?Take any prescribed medicines to control hypertension as told by your health care provider. ?Ask your health care provider if you should monitor your blood pressure at home. ?Have your blood pressure checked every year, even if your blood pressure is normal. Blood pressure increases with age and some medical conditions. ?Diabetes. ?Eating a healthy diet and exercising regularly are important parts of managing your blood sugar (glucose). If your blood sugar cannot be managed through diet and exercise, you may need to take medicines. ?Take any prescribed medicines to control your diabetes as told by your health care provider. ?Get evaluated for obstructive sleep apnea. Talk to your health care provider about getting a sleep evaluation if  you snore a lot or have excessive sleepiness. ?Make sure that any other medical conditions you have, such as atrial fibrillation or atherosclerosis, are managed. ?Nutrition ?Follow instructions from your health care provider about what to eat or drink to help manage your health condition. These instructions may include: ?Reducing your daily calorie intake. ?Limiting how much salt (sodium) you use to 1,500 milligrams (mg) each [place]. ?Using only healthy fats for cooking, such as olive oil, canola oil, or sunflower oil. ?Eating healthy foods. You can do this by: ?Choosing foods that are high in fiber, such as whole grains, and fresh fruits and vegetables. ?Eating at least 5 servings of fruits and vegetables a [place]. Try to fill one-half of your plate with fruits and vegetables at each meal. ?Choosing lean protein foods, such as lean cuts of meat, poultry without skin, fish, tofu, beans, and nuts. ?Eating low-fat dairy products. ?Avoiding foods that are high in sodium. This can help lower blood pressure. ?Avoiding foods that have saturated fat, trans fat, and cholesterol. This can help prevent high cholesterol. ?Avoiding processed and prepared foods. ?Counting your daily carbohydrate intake. ? ?Lifestyle ?If you drink alcohol: ?Limit how much you have to: ?0-1 drink a [place] for women who are not pregnant. ?0-2 drinks a [place] for men. ?Know how much alcohol is in your drink. In the [place] one drink equals one 12 oz bottle of beer (311m), one 5 oz glass of wine (1459m, or one 1? oz glass of hard liquor (4438m ?Do not use any products that contain nicotine or tobacco. These products include cigarettes, chewing tobacco, and vaping devices, such as e-cigarettes. If you need help quitting, ask your health care provider. ?Avoid secondhand smoke. ?Do not use drugs. ?Activity ? ?Try to stay at a healthy weight. ?Get at least 30 minutes of exercise on most days, such as: ?Fast walking. ?Biking. ?Swimming. ?Medicines ?Take  over-the-counter and prescription medicines only as told by your health care provider. Aspirin or blood thinners (antiplatelets or anticoagulants) may be recommended to reduce your risk of forming blood clots that can lead to stroke. ?Avoid taking birth control pills. Talk to your health care provider about the risks of taking birth control pills if: ?You are over 35 35ars old. ?You smoke. ?You get very bad headaches. ?You have had a blood clot. ?Where to find more information ?American Stroke Association: www.strokeassociation.org ?Get help right away if: ?You or a loved one has any symptoms of a stroke. "BE FAST" is an easy way to remember the main warning signs of a stroke: ?B - Balance. Signs are dizziness, sudden trouble walking, or loss of balance. ?E - Eyes. Signs are trouble seeing or a sudden change in vision. ?F - Face. Signs are sudden weakness or numbness of the face, or the face or eyelid drooping on one side. ?A - Arms. Signs are weakness or numbness in an arm. This happens suddenly and usually on one side of the body. ?S - Speech. Signs are sudden trouble speaking, slurred speech, or trouble understanding what people say. ?T - Time. Time to call emergency services. Write down what time symptoms started. ?You or a loved one has other signs of a stroke, such as: ?A sudden, severe headache with no known cause. ?Nausea or vomiting. ?Seizure. ?These symptoms may represent a serious problem that is an emergency. Do not wait to see if the symptoms will go away. Get medical help right away. Call your local emergency services (911 in the [place] Do not  drive yourself to the hospital. ?Summary ?You can help to prevent a stroke by eating healthy, exercising, not smoking, limiting alcohol intake, and managing any medical conditions you may have. ?Do not use any products that contain nicotine or tobacco. These include cigarettes, chewing tobacco, and vaping devices, such as e-cigarettes. If you need help quitting,  ask your health care provider. ?Remember "BE FAST" for warning signs of a stroke. Get help right away if you or a loved one has any of these signs. ?This information is not intended to replace advice given to you by your health c

## 2021-06-17 NOTE — Progress Notes (Signed)
?[facility] ?[address] ?. [place] [address] ?[phone] ? ?     OFFICE FOLLOW-UP NOTE ? ?[name] ?Date of Birth:  Jul 10, 1970 ?Medical Record Number:  [id number]  ? ?HPI: Frank Cuevas is a 51 year old Caucasian male with past medical history of diabetes and hyperlipidemia who presented on 05/31/2021 with sudden onset of dizziness, imbalance, headache left-sided weakness, nausea vomiting and slurred speech.  He was brought in quickly to the hospital and qualified for Jones given IV TNK with quick resolution of his symptoms.  CT scan showed acute infarction in the left PICA territory and CT angiogram showed high-grade stenosis of terminal left vertebral artery.  He was kept in the ICU and blood pressure adequately controlled.  He remained stable and was transferred out.  CT angiogram showed diffuse narrowing of the terminal left V3 segment with occlusion of the vertebral artery with edematous changes in the carotid siphons.  MRI confirmed acute left PICA distribution infarct with also involvement of the adjacent left lateral medulla.  2D echo showed ejection fraction of 55 to 60% without cardiac source of embolism.  LDL cholesterol was 196 mg percent.  Hemoglobin A1c was elevated at 9.5.  Patient had a diagnostic cerebral catheter angiogram which showed tapering of the terminal left vertebral artery at V3-V4 junction with filling defect along the V4 segment resulting in flow limitation but preserved distal flow.  There was also area of focal narrowing of the right V3 V4 junction.  Patient did well during hospitalization and symptoms almost completely resolved.  He had mild ataxia.  He was discharged home and states he continues to do well.  He says at times his mind feels foggy and cannot think as quickly but this is also appears to be improving.  He denies any slurred speech, extremity weakness or significant gait imbalance.  He is having some vision difficulties but he feels this may be  related to his bifocals which need to be changed.  He is tolerating aspirin and Plavix with minor bruising but no bleeding.  He is tolerating Lipitor and Zetia without muscle aches or side effects.  Blood pressures under good control today it is 127/76.  Fasting sugars are doing better but they are still in the 200 range.  He has an appointment to see his PCP tomorrow for more aggressive diabetes control. ? ?ROS:   ?14 system review of systems is positive for imbalance, vision difficulties and all other systems negative ? ?PMH:  ?Past Medical History:  ?Diagnosis Date  ? Diabetes mellitus without complication ([place])   ? ? ?Social History:  ?Social History  ? ?Socioeconomic History  ? Marital status: Married  ?  Spouse name: Not on file  ? Number of children: Not on file  ? Years of education: Not on file  ? Highest education level: Not on file  ?Occupational History  ? Not on file  ?Tobacco Use  ? Smoking status: Never  ? Smokeless tobacco: Never  ?Substance and Sexual Activity  ? Alcohol use: Not on file  ? Drug use: Never  ? Sexual activity: Not on file  ?Other Topics Concern  ? Not on file  ?Social History Narrative  ? Not on file  ? ?Social Determinants of Health  ? ?Financial Resource Strain: Not on file  ?Food Insecurity: Not on file  ?Transportation Needs: Not on file  ?Physical Activity: Not on file  ?Stress: Not on file  ?Social Connections: Not on file  ?Intimate Partner Violence:  Not on file  ? ? ?Medications:   ?Current Outpatient Medications on File Prior to Visit  ?Medication Sig Dispense Refill  ? aspirin EC 81 MG EC tablet Take 1 tablet (81 mg total) by mouth daily. Swallow whole. 30 tablet 11  ? atorvastatin (LIPITOR) 80 MG tablet Take 1 tablet (80 mg total) by mouth daily. 60 tablet 1  ? blood glucose meter kit and supplies Dispense based on patient and insurance preference. Use up to four times daily as directed. (FOR ICD-10 E10.9, E11.9). 1 each 0  ? clopidogrel (PLAVIX) 75 MG tablet Take 1 tablet  (75 mg total) by mouth daily. 60 tablet 1  ? ezetimibe (ZETIA) 10 MG tablet Take 1 tablet (10 mg total) by mouth daily. 60 tablet 1  ? insulin glargine-yfgn (SEMGLEE) 100 UNIT/ML injection Inject 0.1 mLs (10 Units total) into the skin daily. 10 mL 0  ? Insulin Pen Needle 32G X 4 MM MISC 10 Units by Does not apply route daily. 100 each 0  ? ondansetron (ZOFRAN) 4 MG tablet Take 1 tablet (4 mg total) by mouth every 8 (eight) hours as needed for nausea or vomiting. 20 tablet 0  ? ondansetron (ZOFRAN-ODT) 4 MG disintegrating tablet Take 1 tablet (4 mg total) by mouth every 8 (eight) hours as needed for nausea or vomiting. 20 tablet 0  ? OVER THE COUNTER MEDICATION Take 2 tablets by mouth in the morning and at bedtime. Relief factor    ? pioglitazone (ACTOS) 30 MG tablet Take 30 mg by mouth daily.    ? sildenafil (VIAGRA) 100 MG tablet Take 100 mg by mouth daily as needed for erectile dysfunction.    ? ?No current facility-administered medications on file prior to visit.  ? ? ?Allergies:   ?Allergies  ?Allergen Reactions  ? Onion Anaphylaxis  ? ? ?Physical Exam ?General: well developed, well nourished middle-age Caucasian male, seated, in no evident distress ?Head: head normocephalic and atraumatic.  ?Neck: supple with no carotid or supraclavicular bruits ?Cardiovascular: regular rate and rhythm, no murmurs ?Musculoskeletal: no deformity ?Skin:  no rash/petichiae ?Vascular:  Normal pulses all extremities ?Vitals:  ? 06/17/21 1156  ?BP: 127/76  ?Pulse: 85  ? ?Neurologic Exam ?Mental Status: Awake and fully alert. Oriented to place and time. Recent and remote memory intact. Attention span, concentration and fund of knowledge appropriate. Mood and affect appropriate.  ?Cranial Nerves: Fundoscopic exam reveals sharp disc margins. Pupils equal, briskly reactive to light. Extraocular movements full without nystagmus. Visual fields full to confrontation. Hearing intact. Facial sensation intact. Face, tongue, palate moves  normally and symmetrically.  ?Motor: Normal bulk and tone. Normal strength in all tested extremity muscles. ?Sensory.: intact to touch ,pinprick .position and vibratory sensation.  ?Coordination: Rapid alternating movements normal in all extremities. Finger-to-nose and heel-to-shin performed accurately bilaterally. ?Gait and Station: Arises from chair without difficulty. Stance is normal. Gait demonstrates normal stride length and balance slightly unsteady while standing on either foot unsupported. Able to heel, toe and tandem walk with mild difficulty.  ?Reflexes: 1+ and symmetric. Toes downgoing.  ? ?NIHSS  0 ?Modified Rankin  1 ? ? 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  Vascular risk factors of uncontrolled diabetes, hyperlipidemia hypertension and intracranial atherosclerosis. ? ? ? ? ?PLAN: I had a long d/w patient and his girlfriend about his recent  cerebellar stroke, vertebral artery stenosis,risk for recurrent stroke/TIAs, personally independently reviewed  imaging studies and stroke evaluation results and answered questions.Continue aspirin 81 mg daily and  Plavix (clopidogrel)  75 mg daily  for secondary stroke prevention and maintain strict control of hypertension with blood pressure goal below 130/90, diabetes with hemoglobin A1c goal below 6.5% and lipids with LDL cholesterol goal below 70 mg/dL. I also advised the patient to eat a healthy diet with plenty of whole grains, cereals, fruits and vegetables, exercise regularly and maintain ideal body weight .patient is interested in participating in the  CAPTIVA stroke prevention study (aspirin and Plavix compared to aspirin and Brilinta or aspirin and low-dose Xarelto ) and will be given information to review and decide.  Followup in the future with me in 6 months or call earlier if necessary. ?Antony Contras, MD ?Note:  This document was prepared with digital dictation and possible smart phrase technology. Any transcriptional errors that result from this process are unintentional ? ?

## 2021-06-24 ENCOUNTER — Ambulatory Visit: Payer: BC Managed Care – PPO | Admitting: Physical Therapy

## 2021-06-24 DIAGNOSIS — R262 Difficulty in walking, not elsewhere classified: Secondary | ICD-10-CM

## 2021-06-24 DIAGNOSIS — M6281 Muscle weakness (generalized): Secondary | ICD-10-CM

## 2021-06-24 DIAGNOSIS — R6889 Other general symptoms and signs: Secondary | ICD-10-CM

## 2021-06-24 DIAGNOSIS — I639 Cerebral infarction, unspecified: Secondary | ICD-10-CM | POA: Diagnosis not present

## 2021-06-24 NOTE — Patient Instructions (Signed)
Access Code: [email or web address] ?URL: [email or web address] ?Date: 06/24/2021 ?Prepared by: Reggy Eye ? ?Exercises ?- Seated Bicep Curls Supinated with Dumbbells  - 1 x daily - 7 x weekly - 3 sets - 10 reps ?- Seated Overhead Press with Dumbbells  - 1 x daily - 7 x weekly - 3 sets - 10 reps ?- Sit to Stand  - 1 x daily - 7 x weekly - 3 sets - 10 reps ?- Half Deadlift with Kettlebell  - 1 x daily - 7 x weekly - 3 sets - 10 reps ?- Standing Heel Raise  - 1 x daily - 7 x weekly - 3 sets - 10 reps ?

## 2021-06-24 NOTE — Therapy (Signed)
[place] ?[facility] ?[address] ?[place], [place], [address] ?Phone: [phone]   Fax:  [phone] ? ?Physical Therapy Treatment ? ?Patient Details  ?Name: Frank Cuevas ?MRN: [id number] ?Date of Birth: 07-25-1970 ?Referring Provider (PT): Susanne Borders Curt Bears ? ? ?Encounter Date: 06/24/2021 ? ? PT End of Session - 06/24/21 1232   ? ? Visit Number 2   ? Number of Visits 6   ? Date for PT Re-Evaluation 07/24/21   ? Authorization Type BCBS   ? PT Start Time 1151   ? PT Stop Time [id number]   ? PT Time Calculation (min) 38 min   ? Activity Tolerance Patient tolerated treatment well   ? Behavior During Therapy [facility] for tasks assessed/performed   ? ?  ?  ? ?  ? ? ?Past Medical History:  ?Diagnosis Date  ? Diabetes mellitus without complication ([place])   ? ? ?Past Surgical History:  ?Procedure Laterality Date  ? IR ANGIO INTRA EXTRACRAN SEL INTERNAL CAROTID BILAT MOD SED  06/02/2021  ? IR ANGIO VERTEBRAL SEL VERTEBRAL BILAT MOD SED  06/02/2021  ? IR US GUIDE VASC ACCESS RIGHT  06/02/2021  ? NO PAST SURGERIES    ? ? ?There were no vitals filed for this visit. ? ? Subjective Assessment - 06/24/21 1156   ? ? Subjective Pt states he still feels like his head is "foggy".  He feels low energy. He has been practicing walking with head turns and that is "getting better".   ? Patient Stated Goals maintain balance, increased strength and endurance   ? Currently in Pain? No/denies   ? ?  ?  ? ?  ? ? ? ? ? ? ? ? ? ? ? ? ? ? ? ? ? ? ? ? [facility] Adult PT Treatment/Exercise - 06/24/21 0001   ? ?  ? Therapeutic Activites   ? Therapeutic Activities Other Therapeutic Activities   ? Other Therapeutic Activities VOR x 1 with lateral head turns 2 x 15 seconds (pt gets headache after 15 seconds) no dizziness   ?  ? Knee/Hip Exercises: Aerobic  ? Recumbent Bike L6 x 5 min   ?  ? Knee/Hip Exercises: Standing  ? Heel Raises 10 reps;3 sets   ? Heel Raises Limitations 5# bilat   ? Other Standing Knee Exercises modified  dead lift 10# 3 x 10   ?  ? Knee/Hip Exercises: Seated  ? Other Seated Knee/Hip Exercises bicep curl and overhead lift circuit 6# bilat 3 x 10   ? Sit to Sand 10 reps   x 3 with 10#KB  ? ?  ?  ? ?  ? ? ? ? ? ? ? ? ? ? PT Education - 06/24/21 1215   ? ? Education Details updated HEP   ? Person(s) Educated Patient   ? Methods Explanation;Demonstration;Handout   ? Comprehension Verbalized understanding;Returned demonstration   ? ?  ?  ? ?  ? ? ? ? ? ? PT Long Term Goals - 06/12/21 1221   ? ?  ? PT LONG TERM GOAL #1  ? Title Pt will be independent in HEP   ? Time 6   ? Period Weeks   ? Status New   ? Target Date 07/24/21   ?  ? PT LONG TERM GOAL #2  ? Title Pt will improve 5x STS to <= 12 seconds to demo improved strength   ? Time 6   ? Period Weeks   ?  Status New   ? Target Date 07/24/21   ?  ? PT LONG TERM GOAL #3  ? Title Pt will tolerate walking x 10 minutes without seated rest   ? Time 6   ? Period Weeks   ? Status New   ? Target Date 07/24/21   ?  ? PT LONG TERM GOAL #4  ? Title Pt will improve DGI to >= 20 to demo improved balance   ? Time 6   ? Period Weeks   ? Status New   ? Target Date 07/24/21   ? ?  ?  ? ?  ? ? ? ? ? ? ? ? Plan - 06/24/21 1232   ? ? Clinical Impression Statement Progressed to circuit training for continued strengthening and endurance training. Pt with good tolerance but requires frequent breaks. HEP updated. minimal symptoms with VOR exercises today   ? PT Next Visit Plan progress strength and endurance, assess vestibular as necessary   ? PT Home Exercise Plan [id number]   ? Consulted and Agree with Plan of Care Patient   ? ?  ?  ? ?  ? ? ?Patient will benefit from skilled therapeutic intervention in order to improve the following deficits and impairments:    ? ?Visit Diagnosis: ?Decreased functional activity tolerance ? ?Difficulty in walking, not elsewhere classified ? ?Muscle weakness (generalized) ? ? ? ? ?Problem List ?Patient Active Problem List  ? Diagnosis Date Noted  ? DKA (diabetic  ketoacidosis) ([place]) 06/06/2021  ? Stroke determined by clinical assessment ([place]) 05/31/2021  ? ? ?Krosby Ritchie, PT ?06/24/2021, 12:33 PM ? ?[place] ?[facility] ?[address] ?[place], [place], [address] ?Phone: [phone]   Fax:  [phone] ? ?Name: Frank Cuevas ?MRN: [id number] ?Date of Birth: April 19, 1970 ? ? ? ?

## 2021-07-08 ENCOUNTER — Ambulatory Visit: Payer: BC Managed Care – PPO | Attending: Nurse Practitioner | Admitting: Physical Therapy

## 2021-07-08 DIAGNOSIS — M6281 Muscle weakness (generalized): Secondary | ICD-10-CM | POA: Diagnosis present

## 2021-07-08 DIAGNOSIS — R262 Difficulty in walking, not elsewhere classified: Secondary | ICD-10-CM | POA: Diagnosis present

## 2021-07-08 DIAGNOSIS — R6889 Other general symptoms and signs: Secondary | ICD-10-CM | POA: Insufficient documentation

## 2021-07-08 NOTE — Patient Instructions (Signed)
Access Code: [email or web address] ?URL: [email or web address] ?Date: 07/08/2021 ?Prepared by: Reggy Eye ? ?Exercises ?- Sit to Stand Without Arm Support  - 3-4 x daily - 7 x weekly - 1 sets - 10 reps ?- Walking  - 1 x daily - 7 x weekly - 5 sets - 1 reps - 5-7 minutes hold ?- Standing with Head Rotation  - 1 x daily - 7 x weekly - 3 sets - 10 reps ?- Corner Balance Feet Together: Eyes Closed With Head Turns  - 1 x daily - 7 x weekly - 3 sets - 10 reps ?

## 2021-07-08 NOTE — Therapy (Addendum)
[facility] [place] [place] [address] [place], [place], [address] Phone: [phone]   Fax:  [phone]  Physical Therapy Treatment and Discharge  Patient Details  Name: Frank Cuevas MRN: [id number] Date of Birth: 05-29-1970 Referring Provider (PT): Darlina Sicilian   Encounter Date: 07/08/2021   PT End of Session - 07/08/21 1207     Visit Number 3    Date for PT Re-Evaluation 07/24/21    PT Start Time 1100    PT Stop Time 1140    PT Time Calculation (min) 40 min    Activity Tolerance Patient tolerated treatment well    Behavior During Therapy [facility] for tasks assessed/performed             Past Medical History:  Diagnosis Date   Diabetes mellitus without complication ([place])     Past Surgical History:  Procedure Laterality Date   IR ANGIO INTRA EXTRACRAN SEL INTERNAL CAROTID BILAT MOD SED  06/02/2021   IR ANGIO VERTEBRAL SEL VERTEBRAL BILAT MOD SED  06/02/2021   IR US GUIDE VASC ACCESS RIGHT  06/02/2021   NO PAST SURGERIES      There were no vitals filed for this visit.   Subjective Assessment - 07/08/21 1101     Subjective Pt states he still feels "foggy" all the time. He is a little more dizzy today. He is able to ride his bike 6 miles at a time. He still feels he isn't able to go for 14 hours like he used to    Patient Stated Goals maintain balance, increased strength and endurance    Currently in Pain? No/denies                               [facility] Adult PT Treatment/Exercise - 07/08/21 0001       Transfers   Five time sit to stand comments  9.2 seconds      Ambulation/Gait   Gait Comments 6 minute walk: HR start 86, HR finish 102, HR after 1 min rest 94, HR 3 min rest 88      Knee/Hip Exercises: Standing   Other Standing Knee Exercises balance EC head turns vertical and horizontal x 10 each: on ground, on foam, on incline/decline      Knee/Hip Exercises: Seated   Sit to Sand 10 reps   x 3 with  10#KB            Vestibular Treatment/Exercise - 07/08/21 0001       Vestibular Treatment/Exercise   Vestibular Treatment Provided Gaze                    PT Education - 07/08/21 1207     Education Details updated HEP    Person(s) Educated Patient    Methods Explanation;Demonstration;Handout    Comprehension Returned demonstration;Verbalized understanding                 PT Long Term Goals - 07/08/21 1209       PT LONG TERM GOAL #1   Title Pt will be independent in HEP    Status Achieved      PT LONG TERM GOAL #2   Title Pt will improve 5x STS to <= 12 seconds to demo improved strength    Status Achieved      PT LONG TERM GOAL #3   Title Pt will tolerate walking x 10 minutes without seated rest  Status Achieved      PT LONG TERM GOAL #4   Title Pt will improve DGI to >= 20 to demo improved balance    Status On-going                   Plan - 07/08/21 1208     Clinical Impression Statement Pt continues to progress well towards goals. Increased emphasis on VOR and balance today. Good challenge with eyes closed on foam. HEP updated. Good cardiac response to 6 minute walking    PT Next Visit Plan plan to hold at this time    PT Home Exercise Plan [email or web address]    Consulted and Agree with Plan of Care Patient             Patient will benefit from skilled therapeutic intervention in order to improve the following deficits and impairments:     Visit Diagnosis: Decreased functional activity tolerance  Difficulty in walking, not elsewhere classified  Muscle weakness (generalized)     Problem List Patient Active Problem List   Diagnosis Date Noted   DKA (diabetic ketoacidosis) ([place]) 06/06/2021   Stroke determined by clinical assessment ([place]) 05/31/2021   PHYSICAL THERAPY DISCHARGE SUMMARY  Visits from Start of Care: 3  Current functional level related to goals / functional outcomes: Improved balance and activity tolerance    Remaining deficits: See above   Education / Equipment: HEP   Patient agrees to discharge. Patient goals were partially met. Patient is being discharged due to being pleased with the current functional level.  Isabelle Course, PT,DPT06/21/238:30 AM   Isabelle Course, PT 07/08/2021, 12:11 PM  [facility] [place], [place], [address] Phone: [phone]   Fax:  [phone]  Name: Frank Cuevas MRN: [id number] Date of Birth: [birth year]

## 2021-07-16 ENCOUNTER — Other Ambulatory Visit (INDEPENDENT_AMBULATORY_CARE_PROVIDER_SITE_OTHER): Payer: Self-pay

## 2021-07-16 ENCOUNTER — Other Ambulatory Visit: Payer: Self-pay | Admitting: Neurology

## 2021-07-16 DIAGNOSIS — E782 Mixed hyperlipidemia: Secondary | ICD-10-CM

## 2021-07-16 DIAGNOSIS — Z0289 Encounter for other administrative examinations: Secondary | ICD-10-CM

## 2021-07-17 LAB — LIPID PANEL
Chol/HDL Ratio: 2.3 ratio (ref 0.0–5.0)
Cholesterol, Total: 81 mg/dL — ABNORMAL LOW (ref 100–199)
HDL: 35 mg/dL — ABNORMAL LOW (ref >39)
LDL Chol Calc (NIH): 27 mg/dL (ref 0–99)
Triglycerides: 95 mg/dL (ref 0–149)
VLDL Cholesterol Cal: 19 mg/dL (ref 5–40)

## 2021-07-30 ENCOUNTER — Inpatient Hospital Stay: Payer: BC Managed Care – PPO | Admitting: Neurology

## 2021-09-09 ENCOUNTER — Other Ambulatory Visit: Payer: Self-pay

## 2021-09-09 NOTE — Patient Outreach (Signed)
[facility]) Care Management  09/09/2021  Kharee Hohmann 17-Sep-1970 [id number]   First telephone outreach attempt to obtain mRS. No answer. Left message for returned call.  Vanice Sarah [facility] Management Assistant [phone]

## 2021-09-11 ENCOUNTER — Other Ambulatory Visit: Payer: Self-pay

## 2021-09-11 NOTE — Patient Outreach (Signed)
[facility]) Care Management  09/11/2021  Frank Cuevas 1970-03-05 [id number]   Telephone outreach to patient to obtain mRS was successfully completed. MRS= 1  Vanice Sarah [facility] Care Management Assistant [phone]

## 2022-02-10 ENCOUNTER — Other Ambulatory Visit: Payer: Self-pay | Admitting: Neurology

## 2022-02-10 ENCOUNTER — Other Ambulatory Visit (INDEPENDENT_AMBULATORY_CARE_PROVIDER_SITE_OTHER): Payer: Self-pay

## 2022-02-10 ENCOUNTER — Other Ambulatory Visit (INDEPENDENT_AMBULATORY_CARE_PROVIDER_SITE_OTHER): Payer: BC Managed Care – PPO

## 2022-02-10 DIAGNOSIS — R413 Other amnesia: Secondary | ICD-10-CM

## 2022-02-10 DIAGNOSIS — Z0289 Encounter for other administrative examinations: Secondary | ICD-10-CM

## 2022-02-10 NOTE — Progress Notes (Signed)
CAPTIVA RESEARCH STUDY NOTE  Patient returns today for scheduled Captiva research study follow-up visit.  He is accompanied by his wife.  Patient states he has had no recurrent stroke or TIA symptoms.  He is tolerating the statin medication well without any side effects. Modified Rankin scale today was 1 Patient and wife feel for the last 2 to 3 months he has had some cognitive worsening.  He has trouble remembering recent information and at times finding words.  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  He denies any headache.  He denies feeling depressed.  Mental status exam scored 30/30.  Word recall was 3/3.  Clock drawing was 4/4.  Recall 13 animals which can walk on 4 legs.  Geriatric depression scale score 3 not depressed Neurological exam unchanged and no deficits Impression 51 year old male with mild cognitive impairment following stroke  Doubt this is related to the stroke related as complaints are relatively new in the last few months Recommend check lab work for reversible causes of memory loss, EEG and MRI Patient counseled to increase participation in regular activities for stress relaxation like meditation, yoga Continue present captiva stroke study study medication Follow-up as per Captiva stroke study scheduled.  Delia Heady, MD

## 2022-02-10 NOTE — Addendum Note (Signed)
Addended by: Ann Maki on: 02/10/2022 12:24 PM   Modules accepted: Orders

## 2022-02-11 LAB — DEMENTIA PANEL
Homocysteine: 11.6 umol/L (ref 0.0–14.5)
RPR Ser Ql: NONREACTIVE
TSH: 3 u[IU]/mL (ref 0.450–4.500)
Vitamin B-12: 465 pg/mL (ref 232–1245)

## 2022-02-15 ENCOUNTER — Telehealth: Payer: Self-pay | Admitting: Neurology

## 2022-02-15 NOTE — Telephone Encounter (Signed)
Pt scheduled for MR brain w/wo contrast at [facility] for 03/03/22 at 11:00am. Pt stated he has been injured by metal around his eyes and will need an xray before MRI  BCBS [phone] (exp.02/15/22-03/16/22)

## 2022-02-17 ENCOUNTER — Other Ambulatory Visit: Payer: Self-pay | Admitting: Neurology

## 2022-02-17 DIAGNOSIS — R52 Pain, unspecified: Secondary | ICD-10-CM

## 2022-02-18 ENCOUNTER — Ambulatory Visit
Admission: RE | Admit: 2022-02-18 | Discharge: 2022-02-18 | Disposition: A | Discharge status: Home / Self Care | Payer: BC Managed Care – PPO | Source: Ambulatory Visit | Attending: Neurology | Admitting: Neurology

## 2022-02-18 DIAGNOSIS — R52 Pain, unspecified: Secondary | ICD-10-CM

## 2022-02-20 NOTE — Progress Notes (Signed)
Kindly inform the patient that blood work for reversible causes of memory loss was all quite satisfactory.

## 2022-02-23 ENCOUNTER — Ambulatory Visit: Payer: BC Managed Care – PPO

## 2022-02-23 DIAGNOSIS — R413 Other amnesia: Secondary | ICD-10-CM | POA: Diagnosis not present

## 2022-02-23 MED ORDER — GADOBENATE DIMEGLUMINE 529 MG/ML IV SOLN
20.0000 mL | Freq: Once | INTRAVENOUS | Status: AC | PRN
  Administered 2022-02-23: 20 mL via INTRAVENOUS

## 2022-02-25 NOTE — Progress Notes (Signed)
Kindly inform the patient that MRI scan of the brain shows no new strokes or any unexpected findings.  The previously seen stroke shows expected shrinkage.  Nothing to worry about

## 2022-03-03 ENCOUNTER — Other Ambulatory Visit: Payer: BC Managed Care – PPO

## 2022-05-10 ENCOUNTER — Encounter: Payer: Self-pay | Admitting: Neurology

## 2022-07-21 ENCOUNTER — Encounter: Payer: Self-pay | Admitting: Neurology

## 2023-01-11 ENCOUNTER — Encounter: Payer: Self-pay | Admitting: Neurology

## 2023-01-11 ENCOUNTER — Ambulatory Visit: Payer: BC Managed Care – PPO | Admitting: Neurology

## 2023-01-11 VITALS — BP 152/78 | HR 67 | Ht 74.0 in | Wt 255.0 lb

## 2023-01-11 DIAGNOSIS — I6502 Occlusion and stenosis of left vertebral artery: Secondary | ICD-10-CM | POA: Diagnosis not present

## 2023-01-11 DIAGNOSIS — Z8673 Personal history of transient ischemic attack (TIA), and cerebral infarction without residual deficits: Secondary | ICD-10-CM | POA: Diagnosis not present

## 2023-01-11 NOTE — Progress Notes (Signed)
[facility] [address] [place]. [place] [address] [phone]       OFFICE FOLLOW-UP NOTE  Frank Cuevas Date of Birth:  Dec 28, 1970 Medical Record Number:  [id number]   HPI: Initial visit 06/17/2021 Frank Cuevas is a 52 year old Caucasian male with past medical history of diabetes and hyperlipidemia who presented on 05/31/2021 with sudden onset of dizziness, imbalance, headache left-sided weakness, nausea vomiting and slurred speech.  He was brought in quickly to the hospital and qualified for Jones given IV TNK with quick resolution of his symptoms.  CT scan showed acute infarction in the left PICA territory and CT angiogram showed high-grade stenosis of terminal left vertebral artery.  He was kept in the ICU and blood pressure adequately controlled.  He remained stable and was transferred out.  CT angiogram showed diffuse narrowing of the terminal left V3 segment with occlusion of the vertebral artery with edematous changes in the carotid siphons.  MRI confirmed acute left PICA distribution infarct with also involvement of the adjacent left lateral medulla.  2D echo showed ejection fraction of 55 to 60% without cardiac source of embolism.  LDL cholesterol was 196 mg percent.  Hemoglobin A1c was elevated at 9.5.  Patient had a diagnostic cerebral catheter angiogram which showed tapering of the terminal left vertebral artery at V3-V4 junction with filling defect along the V4 segment resulting in flow limitation but preserved distal flow.  There was also area of focal narrowing of the right V3 V4 junction.  Patient did well during hospitalization and symptoms almost completely resolved.  He had mild ataxia.  He was discharged home and states he continues to do well.  He says at times his mind feels foggy and cannot think as quickly but this is also appears to be improving.  He denies any slurred speech, extremity weakness or significant gait imbalance.  He is having some vision difficulties  but he feels this may be related to his bifocals which need to be changed.  He is tolerating aspirin and Plavix with minor bruising but no bleeding.  He is tolerating Lipitor and Zetia without muscle aches or side effects.  Blood pressures under good control today it is 127/76.  Fasting sugars are doing better but they are still in the 200 range.  He has an appointment to see his PCP tomorrow for more aggressive diabetes control. Update 01/11/2023 : He returns for follow-up after last visit a year and a half ago.  He did sign consent and participated in the [place] trial for stroke prevention and finish study participation.  He is presently on aspirin 81 mg alone and is tolerating well without bruising or bleeding.  He is tolerating Lipitor 80 mg well and discontinue Zetia.  It he plans to have lipid profile checked by his primary physician soon.  He continues to have problems controlling his sugars and fasting sugars range from 1 30-1 50 and last hemoglobin A1c on 09/29/2022 was yet suboptimal at 7.6.  He does follow-up with endocrinologist in [place] an he knows he wants to eat healthy and lose weight but has not been successful in doing so.  And has and upcoming visit soon.  He continues to have mild balance difficulties and leg weakness particularly when he is tired or when he tries to walk too fast.  He states his blood pressure is under good control.  He has no new complaints. ROS:   14 system review of systems is positive for imbalance, vision difficulties and  all other systems negative  PMH:  Past Medical History:  Diagnosis Date   Diabetes mellitus without complication (HCC)     Social History:  Social History   Socioeconomic History   Marital status: Married    Spouse name: Not on file   Number of children: Not on file   Years of education: Not on file   Highest education level: Not on file  Occupational History   Not on file  Tobacco Use   Smoking status: Never   Smokeless  tobacco: Never  Substance and Sexual Activity   Alcohol use: Not on file   Drug use: Never   Sexual activity: Not on file  Other Topics Concern   Not on file  Social History Narrative   Not on file   Social Determinants of Health   Financial Resource Strain: Low Risk  (08/17/2022)   Received [facility]   Overall Financial Resource Strain (CARDIA)    Difficulty of Paying Living Expenses: Not hard at all  Food Insecurity: No Food Insecurity (08/17/2022)   Received from [facility]   Hunger Vital Sign    Worried About Running Out of Food in the Last Year: Never true    Ran Out of Food in the Last Year: Never true  Transportation Needs: No Transportation Needs (08/17/2022)   Received from [facility] - Transportation    Lack of Transportation (Medical): No    Lack of Transportation (Non-Medical): No  Physical Activity: Not on file  Stress: No Stress Concern Present (02/08/2020)   Received from [facility] - Occupational Stress Questionnaire    Feeling of Stress : Not at all  Social Connections: Unknown (07/02/2021)   Received from [facility]   Social Network    Social Network: Not on file  Intimate Partner Violence: Unknown (06/02/2021)   Received from [facility]   HITS    Physically Hurt: Not on file    Insult or Talk Down To: Not on file    Threaten Physical Harm: Not on file    Scream or Curse: Not on file    Medications:   Current Outpatient Medications on File Prior to Visit  Medication Sig Dispense Refill   aspirin EC 81 MG EC tablet Take 1 tablet (81 mg total) by mouth daily. Swallow whole. 30 tablet 11   atorvastatin (LIPITOR) 80 MG tablet Take 1 tablet (80 mg total) by mouth daily. 60 tablet 1   blood glucose meter kit and supplies Dispense based on patient and insurance preference. Use up to four times daily as directed. (FOR ICD-10 E10.9, E11.9). 1  each 0   insulin glargine-yfgn (SEMGLEE) 100 UNIT/ML injection Inject 0.1 mLs (10 Units total) into the skin daily. 10 mL 0   Insulin Pen Needle 32G X 4 MM MISC 10 Units by Does not apply route daily. 100 each 0   OVER THE COUNTER MEDICATION Take 2 tablets by mouth in the morning and at bedtime. Relief factor     pioglitazone (ACTOS) 30 MG tablet Take 30 mg by mouth daily.     No current facility-administered medications on file prior to visit.    Allergies:   Allergies  Allergen Reactions   Onion Anaphylaxis    Physical Exam General: well developed, well nourished middle-age Caucasian male, seated, in no evident distress Head: head normocephalic and atraumatic.  Neck: supple with no carotid or supraclavicular  bruits Cardiovascular: regular rate and rhythm, no murmurs Musculoskeletal: no deformity Skin:  no rash/petichiae Vascular:  Normal pulses all extremities Vitals:   01/11/23 1304 01/11/23 1310  BP: (!) 155/80 (!) 152/78  Pulse: 67 67   Neurologic Exam Mental Status: Awake and fully alert. Oriented to place and time. Recent and remote memory intact. Attention span, concentration and fund of knowledge appropriate. Mood and affect appropriate.  Cranial Nerves: Fundoscopic exam not done. Pupils equal, briskly reactive to light. Extraocular movements full without nystagmus. Visual fields full to confrontation. Hearing intact. Facial sensation intact. Face, tongue, palate moves normally and symmetrically.  Motor: Normal bulk and tone. Normal strength in all tested extremity muscles. Sensory.: intact to touch ,pinprick .position and vibratory sensation.  Coordination: Rapid alternating movements normal in all extremities. Finger-to-nose and heel-to-shin performed accurately bilaterally. Gait and Station: Arises from chair without difficulty. Stance is normal. Gait demonstrates normal stride length and balance slightly unsteady while standing on either foot unsupported. Able to heel,  toe and tandem walk with mild difficulty.  Reflexes: 1+ and symmetric. Toes downgoing.   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  Vascular risk factors of uncontrolled diabetes, hyperlipidemia hypertension and intracranial atherosclerosis. Patient completed participation in the [place] trial for stroke prevention.    PLAN: I had a long d/w patient and his girlfriend about his cerebellar stroke, vertebral artery stenosis,risk for recurrent stroke/TIAs, personally independently reviewed imaging studies and stroke evaluation results and answered questions.Continue aspirin 81 mg daily  for secondary stroke prevention and maintain strict control of hypertension with blood pressure goal below 130/90, diabetes with hemoglobin A1c goal below 6.5% and lipids with LDL cholesterol goal below 70 mg/dL. I also advised the patient to eat a healthy diet with plenty of whole grains, cereals, fruits and vegetables, exercise regularly and maintain ideal body weight .Check carotid ultrasound and Transcranial doppler study for surveillance and return for f/u in 6 months or call earlier if needed greater than 50% time during this 35-minute visit was spent in counseling and coordination of care about his stroke and symptomatic vertebral artery stenosis discussion about stroke risk factor modification and prevention and answering questions. Delia Heady, MD Note: This document was prepared with digital dictation and possible smart phrase technology. Any transcriptional errors that result from this process are unintentional

## 2023-01-11 NOTE — Patient Instructions (Addendum)
I had a long d/w patient and his girlfriend about his cerebellar stroke, vertebral artery stenosis,risk for recurrent stroke/TIAs, personally independently reviewed imaging studies and stroke evaluation results and answered questions.Continue aspirin 81 mg daily  for secondary stroke prevention and maintain strict control of hypertension with blood pressure goal below 130/90, diabetes with hemoglobin A1c goal below 6.5% and lipids with LDL cholesterol goal below 70 mg/dL. I also advised the patient to eat a healthy diet with plenty of whole grains, cereals, fruits and vegetables, exercise regularly and maintain ideal body weight .Check carotid ultrasound and Transcranial doppler study for surveillance and return for f/u in 6 months or call earlier if needed.

## 2023-01-12 ENCOUNTER — Telehealth: Payer: Self-pay | Admitting: Neurology

## 2023-01-12 NOTE — Telephone Encounter (Signed)
[facility] Numbers: [id number] exp. 01/12/23-02/10/23 sent to [facility] [phone]

## 2023-02-15 ENCOUNTER — Ambulatory Visit (HOSPITAL_COMMUNITY)
Admission: RE | Admit: 2023-02-15 | Discharge: 2023-02-15 | Disposition: A | Discharge status: Home / Self Care | Payer: BC Managed Care – PPO | Source: Ambulatory Visit | Attending: Neurology | Admitting: Neurology

## 2023-02-15 DIAGNOSIS — I6502 Occlusion and stenosis of left vertebral artery: Secondary | ICD-10-CM | POA: Diagnosis present

## 2023-02-15 DIAGNOSIS — Z8673 Personal history of transient ischemic attack (TIA), and cerebral infarction without residual deficits: Secondary | ICD-10-CM

## 2023-02-20 NOTE — Progress Notes (Signed)
Kindly inform the patient that transcranial Doppler study shows normal velocities in the blood vessels in the front and side on both sides.  The blood vessels in the back could not be visualized due to thick bone and poor acoustic windows.  Nothing to worry about

## 2023-02-20 NOTE — Progress Notes (Signed)
Kindly inform the patient that carotid ultrasound study shows no significant narrowing of either carotid arteries in the neck

## 2023-02-21 NOTE — Telephone Encounter (Signed)
Patient returned call and was please with the results.

## 2023-04-25 ENCOUNTER — Encounter: Payer: Self-pay | Admitting: Neurology

## 2023-08-22 ENCOUNTER — Telehealth: Payer: Self-pay | Admitting: Neurology

## 2023-08-22 NOTE — Telephone Encounter (Signed)
 LVM x2 advising pt of appt r/s needed.

## 2023-08-22 NOTE — Telephone Encounter (Signed)
 LVM and sent MyChart msg informing pt of cancellation on 6/26- MD in meeting. If he calls back, he can be offered 6/24 @ 3:00 pm or beginning/end of [place] on 6/26 per Dr. Rosemarie.

## 2023-08-25 ENCOUNTER — Ambulatory Visit: Payer: BC Managed Care – PPO | Admitting: Neurology

## 2023-11-02 ENCOUNTER — Ambulatory Visit: Admitting: Neurology

## 2023-11-02 ENCOUNTER — Encounter: Payer: Self-pay | Admitting: Neurology

## 2023-11-15 ENCOUNTER — Telehealth: Payer: Self-pay | Admitting: Neurology

## 2023-11-15 DIAGNOSIS — Z0289 Encounter for other administrative examinations: Secondary | ICD-10-CM

## 2023-11-15 NOTE — Telephone Encounter (Signed)
 Patient called to reschedule no show appointment transferred to Billing. Patient was transferred back to schedule appointment.

## 2024-10-16 ENCOUNTER — Ambulatory Visit: Admitting: Neurology
